# Patient Record
Sex: Male | Born: 1980
Health system: Southern US, Community
[De-identification: ages and names within clinical notes are randomized; demographics above are authoritative.]

## PROBLEM LIST (undated history)

## (undated) DIAGNOSIS — K219 Gastro-esophageal reflux disease without esophagitis: Secondary | ICD-10-CM

## (undated) HISTORY — PX: ESOPHAGOGASTRODUODENOSCOPY: SHX1529

## (undated) HISTORY — PX: WISDOM TOOTH EXTRACTION: SHX21

## (undated) HISTORY — PX: ANTERIOR CRUCIATE LIGAMENT REPAIR: SHX115

---

## 2006-03-30 ENCOUNTER — Emergency Department (HOSPITAL_COMMUNITY): Admission: EM | Admit: 2006-03-30 | Discharge: 2006-03-30 | Payer: Self-pay | Admitting: Emergency Medicine

## 2006-07-06 HISTORY — PX: KNEE ARTHROSCOPY: SUR90

## 2006-10-14 ENCOUNTER — Emergency Department (HOSPITAL_COMMUNITY): Admission: EM | Admit: 2006-10-14 | Discharge: 2006-10-15 | Payer: Self-pay | Admitting: Emergency Medicine

## 2007-01-04 ENCOUNTER — Ambulatory Visit (HOSPITAL_BASED_OUTPATIENT_CLINIC_OR_DEPARTMENT_OTHER): Admission: RE | Admit: 2007-01-04 | Discharge: 2007-01-05 | Payer: Self-pay | Admitting: Orthopaedic Surgery

## 2007-10-20 IMAGING — CR DG ANKLE COMPLETE 3+V*R*
3 series · 3 of 3 positions shown · non-contrast
Comparison: none

CLINICAL DATA: Right medial foot and ankle pain

RIGHT ANKLE - 3  VIEW:

[t ankle joint ap right]
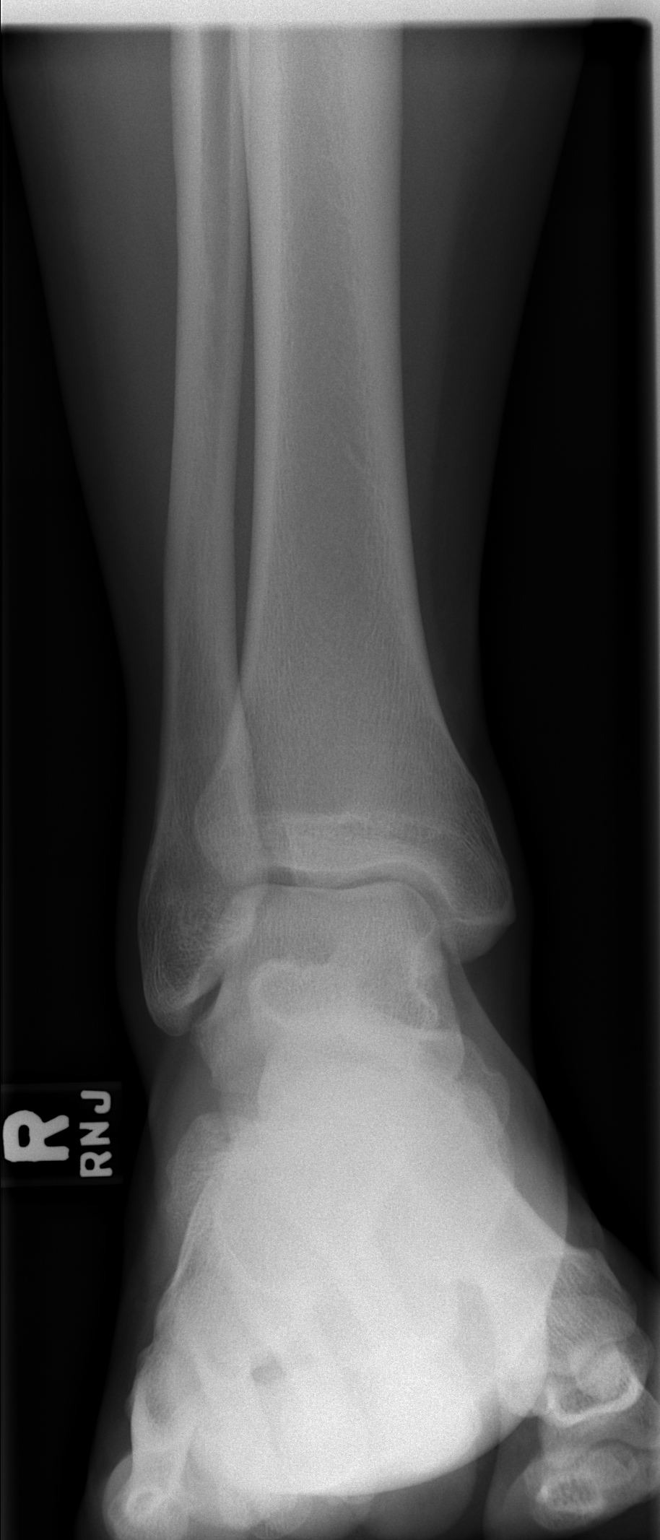

[t ankle joint oblique right]
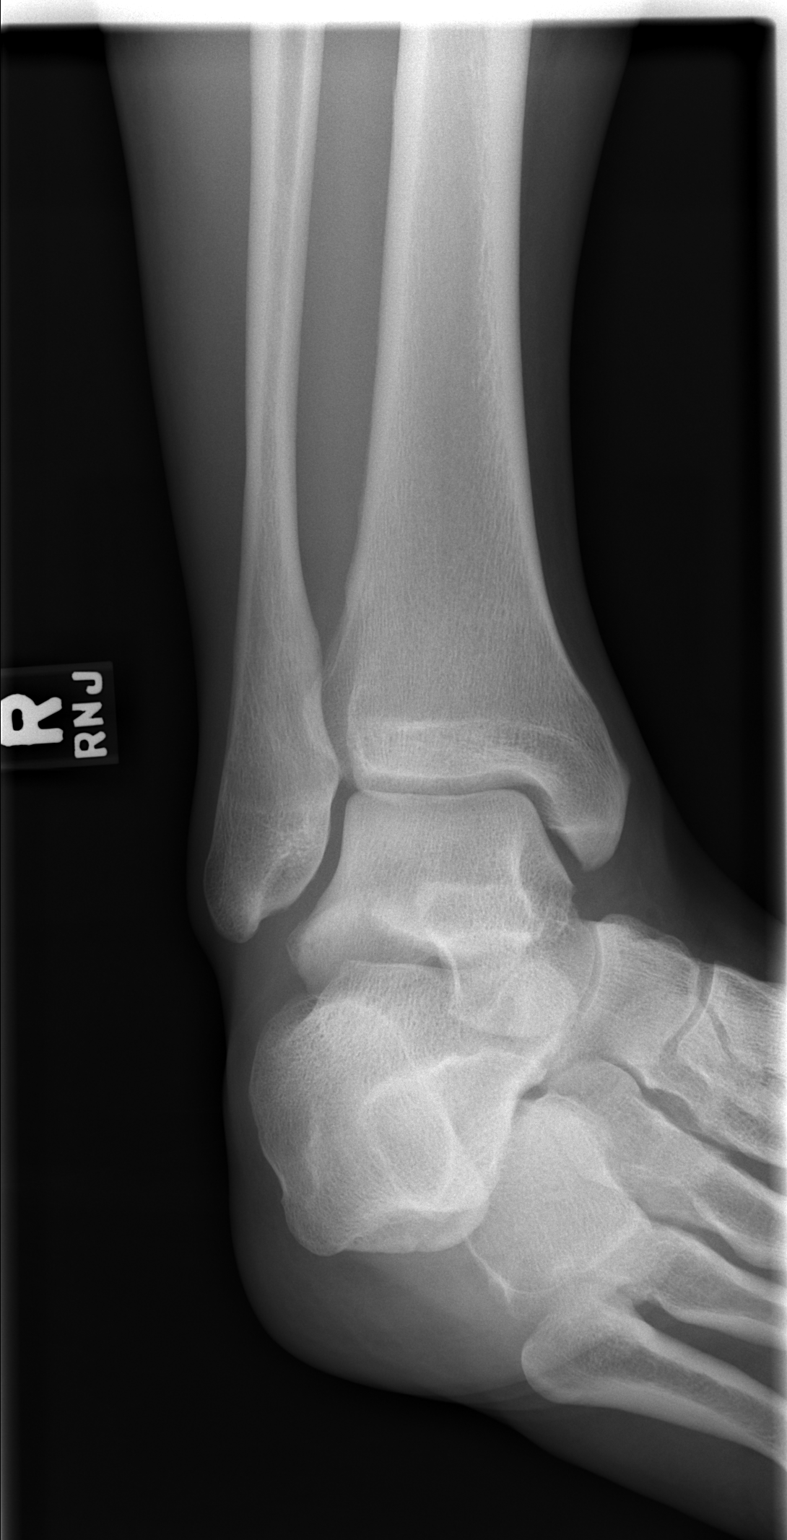

[t ankle joint lat right]
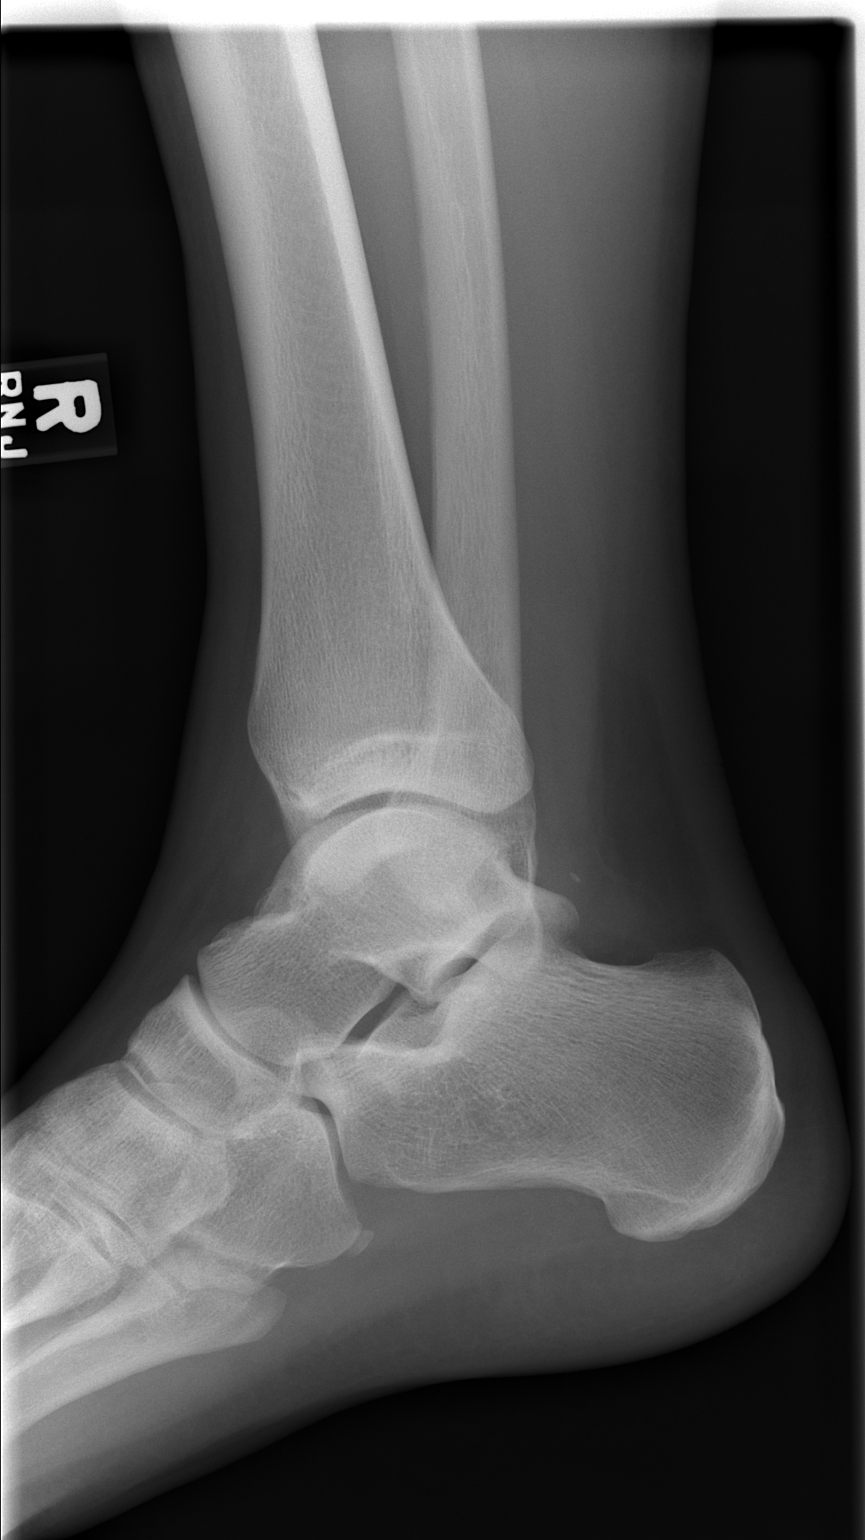

[3 of 3 positions shown; findings below may reference images not displayed]

FINDINGS: There is no evidence of fracture, dislocation, or joint effusion. 
There is no evidence of arthropathy or other focal bone abnormality.  Soft
tissues are unremarkable.
IMPRESSION: Negative.

RIGHT FOOT - 3  VIEW
FINDINGS: There is no evidence of fracture or dislocation.  There is no
evidence of arthropathy or other focal bone abnormality.  Soft tissues are
unremarkable.

IMPRESSION

Negative.

## 2008-12-15 ENCOUNTER — Emergency Department (HOSPITAL_BASED_OUTPATIENT_CLINIC_OR_DEPARTMENT_OTHER): Admission: EM | Admit: 2008-12-15 | Discharge: 2008-12-15 | Payer: Self-pay | Admitting: Emergency Medicine

## 2008-12-22 ENCOUNTER — Ambulatory Visit: Payer: Self-pay | Admitting: Occupational Medicine

## 2008-12-26 ENCOUNTER — Ambulatory Visit: Payer: Self-pay | Admitting: Diagnostic Radiology

## 2008-12-26 ENCOUNTER — Ambulatory Visit (HOSPITAL_BASED_OUTPATIENT_CLINIC_OR_DEPARTMENT_OTHER): Admission: RE | Admit: 2008-12-26 | Discharge: 2008-12-26 | Payer: Self-pay | Admitting: Emergency Medicine

## 2010-07-28 ENCOUNTER — Encounter: Payer: Self-pay | Admitting: Emergency Medicine

## 2010-10-13 LAB — URINALYSIS, ROUTINE W REFLEX MICROSCOPIC
Nitrite: NEGATIVE
Specific Gravity, Urine: 1.03 (ref 1.005–1.030)
Urobilinogen, UA: 0.2 mg/dL (ref 0.0–1.0)

## 2010-10-13 LAB — URINE MICROSCOPIC-ADD ON

## 2010-11-18 NOTE — Op Note (Signed)
Michael Robinson, Michael Robinson             ACCOUNT NO.:  [id number]   MEDICAL RECORD NO.:  [id number]          PATIENT TYPE:  AMB   LOCATION:  DSC                          FACILITY:  [facility]   PHYSICIAN:  Claude Manges. Whitfield, M.D.DATE OF BIRTH:  Dec 07, 1980   DATE OF PROCEDURE:  01/04/2007  DATE OF DISCHARGE:                               OPERATIVE REPORT   PREOPERATIVE DIAGNOSIS:  Anterior cruciate ligament deficient left knee.   POSTOPERATIVE DIAGNOSIS:  Anterior cruciate ligament deficient left  knee.   PROCEDURE:  Allograft anterior cruciate ligament reconstruction, left  knee.   SURGEON:  Claude Manges. Cleophas Dunker, M.D.   ASSISTANT:  Richardean Canal, P.A.   ANESTHESIA:  Femoral nerve block and general orotracheal.   COMPLICATIONS:  None.   HISTORY:  A 30 year old young man sustained an injury to his left knee  in April playing softball.  He had a subsequent MRI scan revealing  meniscal tear and ACL tear and underwent diagnostic arthroscopy on December 21, 2006.  At the time, he had a complete tear of the anterior cruciate  ligament which was debrided and a tear over the posterior portion of  lateral meniscus which was debrided as it was in the white-white zone.  He has had significant problems with his knee with instability since  that time and as recently as one week ago had an episode of his knee  giving way with a recurrent heme-arthrosis.  He is now to have ACL  reconstruction.   PROCEDURE IN DETAIL:  With the patient comfortable on the operating room  table, under general orotracheal anesthesia, the left lower extremity  was placed in a thigh holder in a thigh tourniquet.  The leg was then  prepped with DuraPrep from the thigh holder to the ankle.  Sterile  draping was performed with the extremity elevated, with Esmarch  exsanguinated, with the proximal tourniquet at 350 mmHg.   Diagnostic arthroscopy was performed using the prior medial and lateral  parapatellar tendon puncture sites.   There was a large heme-arthrosis.  This was evacuated.  I reevaluated the medial and lateral compartments  without evidence of articular cartilage injury or meniscal tearing.  The  ACL was obviously torn with only a small thread remaining.  The PCL  remained intact.  I did not seen any loose bodies or evidence of  abnormalities about the patellofemoral joint.  We therefore proceeded  with ACL allograft.   Rexene Edison prepared the graft at 100 mm in length.  The bone plugs were  10 mm in width and easily passed through the 10-mm hole, with bone plugs  25 mm at both ends.   A notchplasty was performed using the green white shaver, the ArthroCare  wand and the 4 mm hooded bur.  I had a very nice decompression  protecting the PCL throughout the procedure.   The Arthrex guide system was utilized.  We initially applied the guide  to the tibia.  A small less than 1 inch incision was made medial to the  patella tendon and the proximal tibia.  Soft tissue was cleared from the  proximal tibia and using the guide, a guide pin was then inserted.  It  was perfectly placed medial to lateral but a little bit anterior and  accordingly, a secondary guide was used to place the pin more posterior  and I felt at that point we had a perfect position.  The 10-mm drill was  then placed over the guide pin protecting the tip of the guide pin with  a curet intraarticularly.  The drill hole was then formed with the  drill.  There was some loose pieces of material in the joint which were  debrided.  Some loose pieces of cartilage and bone around the entrance  hole into the joint, and this was debrided as well.  The femoral guide  was then inserted with the knee flexed beyond 90 degrees.  The Beeth  guide needle was then inserted and externalized along the distal lateral  thigh.  The 10-mm drill was then inserted through the tibial hole into  the joint.  A footprint was then made with bone completely surrounding   the guide pin.  We drilled to approximately 27 mm with a 25 mm bone  plug.  Again, we checked to be sure that we had bone circumferentially.  Any loose material was debrided from the joint.   The ACL allograft was then threaded through the tibia of the joint and  the easily into the femoral hole without difficulty.  A second small  incision was then made over the tibial joint line through the patellar  tendon.  The 7 x 25 metallic interference screw with a sheath was then  applied over the Nitinol guide and easily inserted into the femoral hole  over the bone plug without difficulty.  The pin was then removed with  the knee flexed about 25 degrees of the posterior drawer and tension on  the graft through the tibial hole, a 7 x 25 metallic interference screw  was also inserted.  The graft was checked.  We had very nice tension and  through full range of motion, there was no impingement.  There was a  negative anterior drawer sign.  The joint was inspected for any loose  material and there was none.  At that point, the incisions were closed.  The puncture sites closed with staples.  The incisions over the patella  tendon and the proximal tibia were closed in layers with 0 and 2-0  Vicryl and the skin clips.  A sterile bulky dressing was applied.  Marcaine was injected, followed by the Ace bandage knee immobilizer.  Tourniquet was deflated 90 minutes.   The patient tolerated the procedure without complications.   PLAN:  Recovery care.  Discharge in a.m. on Percocet.      Claude Manges. Cleophas Dunker, M.D.  Electronically Signed     PWW/MEDQ  D:  01/04/2007  T:  01/04/2007  Job:  [id number]

## 2013-10-06 ENCOUNTER — Encounter: Payer: Self-pay | Admitting: Emergency Medicine

## 2013-10-06 ENCOUNTER — Emergency Department (INDEPENDENT_AMBULATORY_CARE_PROVIDER_SITE_OTHER): Payer: Managed Care, Other (non HMO)

## 2013-10-06 ENCOUNTER — Emergency Department
Admission: EM | Admit: 2013-10-06 | Discharge: 2013-10-06 | Disposition: A | Discharge status: Home / Self Care | Payer: Managed Care, Other (non HMO) | Source: Home / Self Care | Attending: Family Medicine | Admitting: Family Medicine

## 2013-10-06 DIAGNOSIS — M109 Gout, unspecified: Secondary | ICD-10-CM

## 2013-10-06 DIAGNOSIS — M79609 Pain in unspecified limb: Secondary | ICD-10-CM

## 2013-10-06 MED ORDER — PREDNISONE 20 MG PO TABS: 20.0000 mg | ORAL_TABLET | Freq: Two times a day (BID) | ORAL | Status: DC

## 2013-10-06 NOTE — Discharge Instructions (Signed)
Increase fluid intake.  May take Tylenol for pain.   Gout Gout is an inflammatory arthritis caused by a buildup of uric acid crystals in the joints. Uric acid is a chemical that is normally present in the blood. When the level of uric acid in the blood is too high it can form crystals that deposit in your joints and tissues. This causes joint redness, soreness, and swelling (inflammation). Repeat attacks are common. Over time, uric acid crystals can form into masses (tophi) near a joint, destroying bone and causing disfigurement. Gout is treatable and often preventable. CAUSES  The disease begins with elevated levels of uric acid in the blood. Uric acid is produced by your body when it breaks down a naturally found substance called purines. Certain foods you eat, such as meats and fish, contain high amounts of purines. Causes of an elevated uric acid level include:  Being passed down from parent to child (heredity).  Diseases that cause increased uric acid production (such as obesity, psoriasis, and certain cancers).  Excessive alcohol use.  Diet, especially diets rich in meat and seafood.  Medicines, including certain cancer-fighting medicines (chemotherapy), water pills (diuretics), and aspirin.  Chronic kidney disease. The kidneys are no longer able to remove uric acid well.  Problems with metabolism. Conditions strongly associated with gout include:  Obesity.  High blood pressure.  High cholesterol.  Diabetes. Not everyone with elevated uric acid levels gets gout. It is not understood why some people get gout and others do not. Surgery, joint injury, and eating too much of certain foods are some of the factors that can lead to gout attacks. SYMPTOMS   An attack of gout comes on quickly. It causes intense pain with redness, swelling, and warmth in a joint.  Fever can occur.  Often, only one joint is involved. Certain joints are more commonly involved:  Base of the big  toe.  Knee.  Ankle.  Wrist.  Finger. Without treatment, an attack usually goes away in a few days to weeks. Between attacks, you usually will not have symptoms, which is different from many other forms of arthritis. DIAGNOSIS  Your caregiver will suspect gout based on your symptoms and exam. In some cases, tests may be recommended. The tests may include:  Blood tests.  Urine tests.  X-rays.  Joint fluid exam. This exam requires a needle to remove fluid from the joint (arthrocentesis). Using a microscope, gout is confirmed when uric acid crystals are seen in the joint fluid. TREATMENT  There are two phases to gout treatment: treating the sudden onset (acute) attack and preventing attacks (prophylaxis).  Treatment of an Acute Attack.  Medicines are used. These include anti-inflammatory medicines or steroid medicines.  An injection of steroid medicine into the affected joint is sometimes necessary.  The painful joint is rested. Movement can worsen the arthritis.  You may use warm or cold treatments on painful joints, depending which works best for you.  Treatment to Prevent Attacks.  If you suffer from frequent gout attacks, your caregiver may advise preventive medicine. These medicines are started after the acute attack subsides. These medicines either help your kidneys eliminate uric acid from your body or decrease your uric acid production. You may need to stay on these medicines for a very long time.  The early phase of treatment with preventive medicine can be associated with an increase in acute gout attacks. For this reason, during the first few months of treatment, your caregiver may also advise you to  take medicines usually used for acute gout treatment. Be sure you understand your caregiver's directions. Your caregiver may make several adjustments to your medicine dose before these medicines are effective.  Discuss dietary treatment with your caregiver or dietitian.  Alcohol and drinks high in sugar and fructose and foods such as meat, poultry, and seafood can increase uric acid levels. Your caregiver or dietician can advise you on drinks and foods that should be limited. HOME CARE INSTRUCTIONS   Do not take aspirin to relieve pain. This raises uric acid levels.  Only take over-the-counter or prescription medicines for pain, discomfort, or fever as directed by your caregiver.  Rest the joint as much as possible. When in bed, keep sheets and blankets off painful areas.  Keep the affected joint raised (elevated).  Apply warm or cold treatments to painful joints. Use of warm or cold treatments depends on which works best for you.  Use crutches if the painful joint is in your leg.  Drink enough fluids to keep your urine clear or pale yellow. This helps your body get rid of uric acid. Limit alcohol, sugary drinks, and fructose drinks.  Follow your dietary instructions. Pay careful attention to the amount of protein you eat. Your daily diet should emphasize fruits, vegetables, whole grains, and fat-free or low-fat milk products. Discuss the use of coffee, vitamin C, and cherries with your caregiver or dietician. These may be helpful in lowering uric acid levels.  Maintain a healthy body weight. SEEK MEDICAL CARE IF:   You develop diarrhea, vomiting, or any side effects from medicines.  You do not feel better in 24 hours, or you are getting worse. SEEK IMMEDIATE MEDICAL CARE IF:   Your joint becomes suddenly more tender, and you have chills or a fever. MAKE SURE YOU:   Understand these instructions.  Will watch your condition.  Will get help right away if you are not doing well or get worse. Document Released: 06/19/2000 Document Revised: 10/17/2012 Document Reviewed: 02/03/2012 [facility] Patient Information 2014 [place], [place].

## 2013-10-06 NOTE — ED Notes (Signed)
Rt foot pain since last night

## 2013-10-06 NOTE — ED Provider Notes (Signed)
CSN: [id number]     Arrival date & time 10/06/13  1653 History   First MD Initiated Contact with Patient 10/06/13 1824     Chief Complaint  Patient presents with  . Foot Pain      HPI Comments: Patient complains of onset of pain in the great toe of his right foot last night.  No known injury.  No recent change in activities.  No fevers, chills, and sweats   Patient is a 33 y.o. male presenting with lower extremity pain. The history is provided by the patient.  Foot Pain This is a new problem. The current episode started yesterday. The problem occurs constantly. The problem has been gradually worsening. Associated symptoms comments: none. The symptoms are aggravated by walking. He has tried nothing for the symptoms.    History reviewed. No pertinent past medical history. Past Surgical History  Procedure Laterality Date  . Anterior cruciate ligament repair     No family history on file. History  Substance Use Topics  . Smoking status: Never Smoker   . Smokeless tobacco: Not on file  . Alcohol Use: Yes    Review of Systems  All other systems reviewed and are negative.    Allergies  Review of patient's allergies indicates no known allergies.  Home Medications   Current Outpatient Rx  Name  Route  Sig  Dispense  Refill  . predniSONE (DELTASONE) 20 MG tablet   Oral   Take 1 tablet (20 mg total) by mouth 2 (two) times daily. Take with food.   10 tablet   0    BP 113/77  Pulse 80  Temp(Src) 98.4 F (36.9 C) (Oral)  Ht 6' (1.829 m)  Wt 213 lb (96.616 kg)  BMI 28.88 kg/m2  SpO2 97% Physical Exam  Nursing note and vitals reviewed. Constitutional: He is oriented to person, place, and time. He appears well-developed and well-nourished. No distress.  HENT:  Head: Atraumatic.  Eyes: Conjunctivae are normal. Pupils are equal, round, and reactive to light.  Musculoskeletal:       Right foot: He exhibits decreased range of motion, tenderness and bony tenderness. He exhibits  no swelling, normal capillary refill, no crepitus, no deformity and no laceration.       Feet:  There is warmth and distinct tenderness over the right MTP joint.  No swelling.  Mild erythema present.  Distal neurovascular function is intact.   Neurological: He is alert and oriented to person, place, and time.  Skin: Skin is warm and dry. No rash noted.    ED Course  Procedures  none    Imaging Review Dg Foot Complete Right  10/06/2013   CLINICAL DATA:  Foot pain without injury  EXAM: RIGHT FOOT COMPLETE - 3+ VIEW  COMPARISON:  None.  FINDINGS: There is no evidence of fracture or dislocation. There is no evidence of arthropathy or other focal bone abnormality. Soft tissues are unremarkable.  IMPRESSION: No acute abnormality noted.   Electronically Signed   By: Alcide CleverMark  Lukens M.D.   On: 10/06/2013 18:20     MDM   1. Acute gout    Uric acid pending Begin prednisone burst. Increase fluid intake.  May take Tylenol for pain. Followup with Family Doctor if not improved in one week.     Lattie HawStephen A Beese, MD 10/08/13 [phone]

## 2013-10-07 LAB — URIC ACID: URIC ACID, SERUM: 7.1 mg/dL (ref 4.0–7.8)

## 2014-01-17 ENCOUNTER — Encounter (HOSPITAL_BASED_OUTPATIENT_CLINIC_OR_DEPARTMENT_OTHER): Payer: Self-pay | Admitting: Emergency Medicine

## 2014-01-17 ENCOUNTER — Emergency Department (HOSPITAL_BASED_OUTPATIENT_CLINIC_OR_DEPARTMENT_OTHER)
Admission: EM | Admit: 2014-01-17 | Discharge: 2014-01-17 | Disposition: A | Discharge status: Home / Self Care | Payer: Managed Care, Other (non HMO) | Attending: Emergency Medicine | Admitting: Emergency Medicine

## 2014-01-17 DIAGNOSIS — R197 Diarrhea, unspecified: Secondary | ICD-10-CM

## 2014-01-17 DIAGNOSIS — R5383 Other fatigue: Secondary | ICD-10-CM

## 2014-01-17 DIAGNOSIS — R109 Unspecified abdominal pain: Secondary | ICD-10-CM | POA: Insufficient documentation

## 2014-01-17 DIAGNOSIS — R638 Other symptoms and signs concerning food and fluid intake: Secondary | ICD-10-CM | POA: Insufficient documentation

## 2014-01-17 DIAGNOSIS — R5381 Other malaise: Secondary | ICD-10-CM | POA: Insufficient documentation

## 2014-01-17 DIAGNOSIS — Z792 Long term (current) use of antibiotics: Secondary | ICD-10-CM | POA: Insufficient documentation

## 2014-01-17 DIAGNOSIS — Z8719 Personal history of other diseases of the digestive system: Secondary | ICD-10-CM | POA: Insufficient documentation

## 2014-01-17 DIAGNOSIS — R110 Nausea: Secondary | ICD-10-CM | POA: Insufficient documentation

## 2014-01-17 DIAGNOSIS — IMO0002 Reserved for concepts with insufficient information to code with codable children: Secondary | ICD-10-CM | POA: Insufficient documentation

## 2014-01-17 HISTORY — DX: Gastro-esophageal reflux disease without esophagitis: K21.9

## 2014-01-17 LAB — GI PATHOGEN PANEL BY PCR, STOOL
C difficile toxin A/B: NEGATIVE
Campylobacter by PCR: NEGATIVE
Cryptosporidium by PCR: NEGATIVE
E COLI 0157 BY PCR: NEGATIVE
E coli (ETEC) LT/ST: NEGATIVE
E coli (STEC): NEGATIVE
G LAMBLIA BY PCR: NEGATIVE
NOROVIRUS G1/G2: NEGATIVE
Rotavirus A by PCR: NEGATIVE
SALMONELLA BY PCR: NEGATIVE
SHIGELLA BY PCR: NEGATIVE

## 2014-01-17 LAB — URINALYSIS, ROUTINE W REFLEX MICROSCOPIC
BILIRUBIN URINE: NEGATIVE
Glucose, UA: NEGATIVE mg/dL
KETONES UR: NEGATIVE mg/dL
LEUKOCYTES UA: NEGATIVE
NITRITE: NEGATIVE
PH: 5.5 (ref 5.0–8.0)
PROTEIN: NEGATIVE mg/dL
Specific Gravity, Urine: 1.026 (ref 1.005–1.030)
Urobilinogen, UA: 0.2 mg/dL (ref 0.0–1.0)

## 2014-01-17 LAB — URINE MICROSCOPIC-ADD ON

## 2014-01-17 MED ORDER — ONDANSETRON HCL 4 MG/2ML IJ SOLN
4.0000 mg | Freq: Once | INTRAMUSCULAR | Status: AC
  Administered 2014-01-17: 4 mg via INTRAVENOUS
  Filled 2014-01-17: qty 2

## 2014-01-17 MED ORDER — ONDANSETRON HCL 4 MG PO TABS: 4.0000 mg | ORAL_TABLET | Freq: Three times a day (TID) | ORAL | Status: DC | PRN

## 2014-01-17 MED ORDER — SODIUM CHLORIDE 0.9 % IV BOLUS (SEPSIS)
1000.0000 mL | Freq: Once | INTRAVENOUS | Status: AC
  Administered 2014-01-17: 1000 mL via INTRAVENOUS

## 2014-01-17 MED ORDER — DICYCLOMINE HCL 20 MG PO TABS: 20.0000 mg | ORAL_TABLET | Freq: Two times a day (BID) | ORAL | Status: DC | PRN

## 2014-01-17 NOTE — ED Provider Notes (Signed)
CSN: [id number]     Arrival date & time 01/17/14  4098 History   First MD Initiated Contact with Patient 01/17/14 0700     Chief Complaint  Patient presents with  . Diarrhea     (Consider location/radiation/quality/duration/timing/severity/associated sxs/prior Treatment) Patient is a 33 y.o. male presenting with diarrhea. The history is provided by the patient.  Diarrhea Quality:  Watery Associated symptoms: abdominal pain   Associated symptoms: no headaches and no vomiting    patient presents with watery diarrhea. He's had for around 5 days. He's had some chills. He's had nausea without frank vomiting. He states the area was initially all water but now is a little green in color. Some crampy abdominal pain. No sick contacts. He he was recently at the beach in some raw oysters. He was around a week prior to the start of the diarrhea. His been seen by his primary care Dr. started on Cipro and Flagyl. He continues to feel poorly. No other recent travel. No other recent antibiotics.  Past Medical History  Diagnosis Date  . GERD (gastroesophageal reflux disease)    Past Surgical History  Procedure Laterality Date  . Anterior cruciate ligament repair     History reviewed. No pertinent family history. History  Substance Use Topics  . Smoking status: Never Smoker   . Smokeless tobacco: Not on file  . Alcohol Use: Yes    Review of Systems  Constitutional: Positive for appetite change and fatigue. Negative for activity change.  Eyes: Negative for pain.  Respiratory: Negative for chest tightness and shortness of breath.   Cardiovascular: Negative for chest pain and leg swelling.  Gastrointestinal: Positive for nausea, abdominal pain and diarrhea. Negative for vomiting and blood in stool.  Genitourinary: Negative for flank pain.  Musculoskeletal: Negative for back pain and neck stiffness.  Skin: Negative for rash.  Neurological: Negative for weakness, numbness and headaches.   Psychiatric/Behavioral: Negative for behavioral problems.      Allergies  Review of patient's allergies indicates no known allergies.  Home Medications   Prior to Admission medications   Medication Sig Start Date End Date Taking? Authorizing Provider  metroNIDAZOLE (FLAGYL) 500 MG tablet Take 500 mg by mouth 3 (three) times daily.   Yes Historical Provider, MD  ondansetron (ZOFRAN-ODT) 4 MG disintegrating tablet Take 4 mg by mouth every 8 (eight) hours as needed for nausea or vomiting.   Yes Historical Provider, MD  predniSONE (DELTASONE) 20 MG tablet Take 1 tablet (20 mg total) by mouth 2 (two) times daily. Take with food. 10/06/13   Lattie Haw, MD   BP 116/78  Pulse 98  Temp(Src) 98.4 F (36.9 C) (Oral)  Resp 18  Ht 6' (1.829 m)  Wt 225 lb (102.059 kg)  BMI 30.51 kg/m2  SpO2 98% Physical Exam  Nursing note and vitals reviewed. Constitutional: He is oriented to person, place, and time. He appears well-developed and well-nourished.  HENT:  Head: Normocephalic and atraumatic.  Eyes: EOM are normal. Pupils are equal, round, and reactive to light.  Neck: Normal range of motion. Neck supple.  Cardiovascular: Normal rate, regular rhythm and normal heart sounds.   No murmur heard. Pulmonary/Chest: Effort normal and breath sounds normal.  Abdominal: Soft. He exhibits no distension. There is no tenderness.  Musculoskeletal: Normal range of motion. He exhibits no edema.  Neurological: He is alert and oriented to person, place, and time. No cranial nerve deficit.  Skin: Skin is warm and dry.  Psychiatric: He has a  normal mood and affect.    ED Course  Procedures (including critical care time) Labs Review Labs Reviewed  URINALYSIS, ROUTINE W REFLEX MICROSCOPIC - Abnormal; Notable for the following:    Color, Urine AMBER (*)    Hgb urine dipstick MODERATE (*)    All other components within normal limits  URINE MICROSCOPIC-ADD ON - Abnormal; Notable for the following:     Bacteria, UA MANY (*)    All other components within normal limits  GI PATHOGEN PANEL BY PCR, STOOL    Imaging Review No results found.   EKG Interpretation None      MDM   Final diagnoses:  None    Patient with diarrhea for the last 5 days. Watery. Urinalysis is reassuring and does not show that the patient is overly dehydrated. He feels better after 2 L of fluid. We'll send stool cultures which will need to be followed by PCP or gastroenterology. Patient has tolerated orals will be discharged home    Juliet Rudeathan R. Rubin PayorPickering, MD 01/17/14 1440

## 2014-01-17 NOTE — ED Notes (Signed)
Pt reports that he was given 2 antibiotics

## 2014-01-17 NOTE — ED Notes (Signed)
MD at bedside. 

## 2014-01-17 NOTE — ED Notes (Signed)
Pt reports he has had diarrhea since Sunday, saw pcp was given nausea medicine on Monday, but diarrhea

## 2014-01-17 NOTE — Discharge Instructions (Signed)
Diarrhea  Diarrhea is frequent loose and watery bowel movements. It can cause you to feel weak and dehydrated. Dehydration can cause you to become tired and thirsty, have a dry mouth, and have decreased urination that often is dark yellow. Diarrhea is a sign of another problem, most often an infection that will not last long. In most cases, diarrhea typically lasts 2-3 days. However, it can last longer if it is a sign of something more serious. It is important to treat your diarrhea as directed by your caregive to lessen or prevent future episodes of diarrhea.  CAUSES   Some common causes include:   Gastrointestinal infections caused by viruses, bacteria, or parasites.   Food poisoning or food allergies.   Certain medicines, such as antibiotics, chemotherapy, and laxatives.   Artificial sweeteners and fructose.   Digestive disorders.  HOME CARE INSTRUCTIONS   Ensure adequate fluid intake (hydration): have 1 cup (8 oz) of fluid for each diarrhea episode. Avoid fluids that contain simple sugars or sports drinks, fruit juices, whole milk products, and sodas. Your urine should be clear or pale yellow if you are drinking enough fluids. Hydrate with an oral rehydration solution that you can purchase at pharmacies, retail stores, and online. You can prepare an oral rehydration solution at home by mixing the following ingredients together:    - tsp table salt.    tsp baking soda.    tsp salt substitute containing potassium chloride.   1  tablespoons sugar.   1 L (34 oz) of water.   Certain foods and beverages may increase the speed at which food moves through the gastrointestinal (GI) tract. These foods and beverages should be avoided and include:   Caffeinated and alcoholic beverages.   High-fiber foods, such as raw fruits and vegetables, nuts, seeds, and whole grain breads and cereals.   Foods and beverages sweetened with sugar alcohols, such as xylitol, sorbitol, and mannitol.   Some foods may be well  tolerated and may help thicken stool including:   Starchy foods, such as rice, toast, pasta, low-sugar cereal, oatmeal, grits, baked potatoes, crackers, and bagels.   Bananas.   Applesauce.   Add probiotic-rich foods to help increase healthy bacteria in the GI tract, such as yogurt and fermented milk products.   Wash your hands well after each diarrhea episode.   Only take over-the-counter or prescription medicines as directed by your caregiver.   Take a warm bath to relieve any burning or pain from frequent diarrhea episodes.  SEEK IMMEDIATE MEDICAL CARE IF:    You are unable to keep fluids down.   You have persistent vomiting.   You have blood in your stool, or your stools are black and tarry.   You do not urinate in 6-8 hours, or there is only a small amount of very dark urine.   You have abdominal pain that increases or localizes.   You have weakness, dizziness, confusion, or lightheadedness.   You have a severe headache.   Your diarrhea gets worse or does not get better.   You have a fever or persistent symptoms for more than 2-3 days.   You have a fever and your symptoms suddenly get worse.  MAKE SURE YOU:    Understand these instructions.   Will watch your condition.   Will get help right away if you are not doing well or get worse.  Document Released: 06/12/2002 Document Revised: 06/08/2012 Document Reviewed: 02/28/2012  [employer] Patient Information 2015 [employer]. This information   is not intended to replace advice given to you by your health care provider. Make sure you discuss any questions you have with your health care provider.

## 2014-01-24 ENCOUNTER — Telehealth (HOSPITAL_COMMUNITY): Payer: Self-pay | Admitting: Emergency Medicine

## 2014-01-31 ENCOUNTER — Ambulatory Visit (INDEPENDENT_AMBULATORY_CARE_PROVIDER_SITE_OTHER): Payer: Managed Care, Other (non HMO) | Admitting: Cardiology

## 2014-01-31 ENCOUNTER — Encounter: Payer: Self-pay | Admitting: Cardiology

## 2014-01-31 VITALS — BP 122/78 | HR 61 | Ht 72.0 in | Wt 221.0 lb

## 2014-01-31 DIAGNOSIS — R079 Chest pain, unspecified: Secondary | ICD-10-CM

## 2014-01-31 DIAGNOSIS — Z8249 Family history of ischemic heart disease and other diseases of the circulatory system: Secondary | ICD-10-CM

## 2014-01-31 NOTE — Progress Notes (Signed)
   Michael Robinson Date of Birth: 1980/09/22 Medical Record #[id number]  History of Present Illness: Michael Robinson is self referred for cardiac evaluation. He is a pleasant 33 yo WM with a strong family history of CAD. His father just had CABG at age 865. Both paternal grandparents died with MI- his grandfather at age 35. He denies any history of HTN, DM, or tobacco abuse. Does not know his cholesterol. He has noted intermittent chest tightness with stress or anxiety. He does eat a heart healthy diet. He works for [employer].    Medication List    Notice As of 01/31/2014  1:18 PM   You have not been prescribed any medications.      No Known Allergies  Past Medical History  Diagnosis Date  . GERD (gastroesophageal reflux disease)     Past Surgical History  Procedure Laterality Date  . Anterior cruciate ligament repair      History   Social History  . Marital Status: Married    Spouse Name: N/A    Number of Children: 2  . Years of Education: N/A   Occupational History  . logistics [employer]    Social History Main Topics  . Smoking status: Never Smoker   . Smokeless tobacco: None  . Alcohol Use: Yes  . Drug Use: None  . Sexual Activity: None   Other Topics Concern  . None   Social History Narrative  . None    History reviewed. No pertinent family history.  Review of Systems: As noted in HPI.  All other systems were reviewed and are negative.  Physical Exam: BP 122/78  Pulse 61  Ht 6' (1.829 m)  Wt 221 lb (100.245 kg)  BMI 29.97 kg/m2 Filed Weights   01/31/14 0921  Weight: 221 lb (100.245 kg)  GENERAL:  Well appearing WM in NAD HEENT:  PERRL, EOMI, sclera are clear. Oropharynx is clear. NECK:  No jugular venous distention, carotid upstroke brisk and symmetric, no bruits, no thyromegaly or adenopathy LUNGS:  Clear to auscultation bilaterally CHEST:  Unremarkable HEART:  RRR,  PMI not displaced or sustained,S1 and S2 within normal limits, no S3, no S4: no  clicks, no rubs, no murmurs ABD:  Soft, nontender. BS +, no masses or bruits. No hepatomegaly, no splenomegaly EXT:  2 + pulses throughout, no edema, no cyanosis no clubbing SKIN:  Warm and dry.  No rashes NEURO:  Alert and oriented x 3. Cranial nerves II through XII intact. PSYCH:  Cognitively intact    LABORATORY DATA: Ecg: NSR with normal Ecg.  Assessment / Plan: 1. Symptoms of chest tightness. Possible angina. Will arrange a GXT.  2. Strong family history of CAD  Plan: schedule GXT. Stressed importance of heart healthy diet and regular aerobic exercise. Will obtain fasting lab work including chemistries, CBC, and lipids.

## 2014-01-31 NOTE — Patient Instructions (Signed)
Recommend a Mediterranean style diet  Recommend regular aerobic exercise 30-45 minutes daily  Will schedule you for fasting lab work and an exercise stress test.

## 2014-02-16 ENCOUNTER — Encounter (HOSPITAL_COMMUNITY): Payer: Managed Care, Other (non HMO)

## 2014-06-09 ENCOUNTER — Emergency Department
Admission: EM | Admit: 2014-06-09 | Discharge: 2014-06-09 | Disposition: A | Discharge status: Home / Self Care | Payer: Managed Care, Other (non HMO) | Source: Home / Self Care | Attending: Family Medicine | Admitting: Family Medicine

## 2014-06-09 ENCOUNTER — Emergency Department (INDEPENDENT_AMBULATORY_CARE_PROVIDER_SITE_OTHER): Payer: Managed Care, Other (non HMO)

## 2014-06-09 DIAGNOSIS — M545 Low back pain: Secondary | ICD-10-CM

## 2014-06-09 DIAGNOSIS — M5416 Radiculopathy, lumbar region: Secondary | ICD-10-CM

## 2014-06-09 DIAGNOSIS — M5417 Radiculopathy, lumbosacral region: Secondary | ICD-10-CM

## 2014-06-09 MED ORDER — CYCLOBENZAPRINE HCL 10 MG PO TABS: ORAL_TABLET | ORAL | Status: DC

## 2014-06-09 MED ORDER — HYDROCODONE-ACETAMINOPHEN 5-325 MG PO TABS: ORAL_TABLET | ORAL | Status: DC

## 2014-06-09 MED ORDER — KETOROLAC TROMETHAMINE 60 MG/2ML IM SOLN
60.0000 mg | Freq: Once | INTRAMUSCULAR | Status: AC
  Administered 2014-06-09: 60 mg via INTRAMUSCULAR

## 2014-06-09 MED ORDER — MELOXICAM 15 MG PO TABS: 15.0000 mg | ORAL_TABLET | Freq: Every day | ORAL | Status: DC

## 2014-06-09 NOTE — ED Provider Notes (Signed)
CSN: [id number]     Arrival date & time 06/09/14  1130 History   First MD Initiated Contact with Patient 06/09/14 1142     Chief Complaint  Patient presents with  . Back Pain    Right side       HPI Comments: Patient was cutting trees today.  About one hour prior to his visit he bent over to pull a log and felt sudden pain in his right lower back radiating to his right anterior thigh to the knee.  No bowel or bladder dysfunction.  No saddle numbness. In 2010 he developed back pain and underwent MRI of lumbar spine that revealed a possible minimal vertebral fracture  Patient is a 33 y.o. male presenting with back pain. The history is provided by the patient and the spouse.  Back Pain Location:  Lumbar spine Quality:  Aching Radiates to: right anterior thigh. Pain severity:  Moderate Pain is:  Same all the time Onset quality:  Sudden Duration:  2 hours Timing:  Constant Chronicity:  Recurrent Context comment:  Cutting trees Relieved by:  Nothing Worsened by:  Movement Ineffective treatments:  None tried Associated symptoms: paresthesias and tingling   Associated symptoms: no abdominal pain, no bladder incontinence, no bowel incontinence, no dysuria, no fever, no leg pain, no numbness, no perianal numbness, no weakness and no weight loss     Past Medical History  Diagnosis Date  . GERD (gastroesophageal reflux disease)    Past Surgical History  Procedure Laterality Date  . Anterior cruciate ligament repair     No family history on file. History  Substance Use Topics  . Smoking status: Never Smoker   . Smokeless tobacco: Not on file  . Alcohol Use: Yes    Review of Systems  Constitutional: Negative for fever and weight loss.  Gastrointestinal: Negative for abdominal pain and bowel incontinence.  Genitourinary: Negative for bladder incontinence and dysuria.  Musculoskeletal: Positive for back pain.  Neurological: Positive for tingling and paresthesias. Negative for  weakness and numbness.  All other systems reviewed and are negative.   Allergies  Review of patient's allergies indicates no known allergies.  Home Medications   Prior to Admission medications   Medication Sig Start Date End Date Taking? Authorizing Provider  cyclobenzaprine (FLEXERIL) 10 MG tablet Take one tab by mouth at bedtime for muscle spasm 06/09/14   Lattie HawStephen A Dinna Severs, MD  HYDROcodone-acetaminophen (NORCO/VICODIN) 5-325 MG per tablet Take one by mouth at bedtime as needed for pain 06/09/14   Lattie HawStephen A Josph [place], MD  meloxicam (MOBIC) 15 MG tablet Take 1 tablet (15 mg total) by mouth daily. Take with food each morning 06/09/14   Lattie HawStephen A Maliah Pyles, MD   BP 114/78 mmHg  Pulse 72  Temp(Src) 98.2 F (36.8 C) (Oral)  Wt 225 lb (102.059 kg)  SpO2 98% Physical Exam  Constitutional: He is oriented to person, place, and time. He appears well-developed and well-nourished. No distress.  HENT:  Head: Atraumatic.  Eyes: Pupils are equal, round, and reactive to light.  Cardiovascular: Normal heart sounds.   Pulmonary/Chest: Breath sounds normal.  Abdominal: Bowel sounds are normal. He exhibits no distension. There is no tenderness. There is no rebound.  Musculoskeletal:       Lumbar back: He exhibits decreased range of motion. He exhibits no tenderness, no bony tenderness, no swelling, no edema, no deformity and no laceration.       Back:  Back:  Can heel/toe walk and squat without difficulty.  Decreased  forward flexion.   Straight leg raising test is negative.  Sitting knee extension test is negative.  Strength and sensation in the lower extremities is normal.  Patellar and achilles reflexes are normal. Location of subjective pain as noted on diagram, but no tenderness to palpation     Neurological: He is alert and oriented to person, place, and time.  Skin: Skin is warm and dry. No rash noted.  Nursing note and vitals reviewed.   ED Course  Procedures  none   Imaging Review Dg Lumbar  Spine Complete  06/09/2014   CLINICAL DATA:  Low back pain  EXAM: LUMBAR SPINE - COMPLETE 4+ VIEW  COMPARISON:  Lumbar spine MRI 12/26/2008  FINDINGS: There is no evidence of lumbar spine fracture. Alignment is normal. Intervertebral disc spaces are maintained.  IMPRESSION: Negative.   Electronically Signed   By: Christiana PellantGretchen  Green M.D.   On: 06/09/2014 12:54     MDM   1. Lumbar back pain with radiculopathy affecting right lower extremity    Begin Mobic and Flexeril.  Lortab for pain at night. Apply ice pack for 20 to 30 minutes, 3 to 4 times daily  Continue until pain decreases.  Begin back exercises as tolerated. Followup with Dr. Rodney Langtonhomas Thekkekandam ([facility]) if not improving about two weeks.     Lattie HawStephen A Anella Nakata, MD 06/12/14 [phone]

## 2014-06-09 NOTE — ED Notes (Signed)
Patient states was cutting trees today and injured back, radiates to right side

## 2014-06-09 NOTE — Discharge Instructions (Signed)
Apply ice pack for 20 to 30 minutes, 3 to 4 times daily  Continue until pain decreases.  Begin back exercises as tolerated.   Back Exercises Back exercises help treat and prevent back injuries. The goal of back exercises is to increase the strength of your abdominal and back muscles and the flexibility of your back. These exercises should be started when you no longer have back pain. Back exercises include:  Pelvic Tilt. Lie on your back with your knees bent. Tilt your pelvis until the lower part of your back is against the floor. Hold this position 5 to 10 sec and repeat 5 to 10 times.  Knee to Chest. Pull first 1 knee up against your chest and hold for 20 to 30 seconds, repeat this with the other knee, and then both knees. This may be done with the other leg straight or bent, whichever feels better.  Sit-Ups or Curl-Ups. Bend your knees 90 degrees. Start with tilting your pelvis, and do a partial, slow sit-up, lifting your trunk only 30 to 45 degrees off the floor. Take at least 2 to 3 seconds for each sit-up. Do not do sit-ups with your knees out straight. If partial sit-ups are difficult, simply do the above but with only tightening your abdominal muscles and holding it as directed.  Hip-Lift. Lie on your back with your knees flexed 90 degrees. Push down with your feet and shoulders as you raise your hips a couple inches off the floor; hold for 10 seconds, repeat 5 to 10 times.  Back arches. Lie on your stomach, propping yourself up on bent elbows. Slowly press on your hands, causing an arch in your low back. Repeat 3 to 5 times. Any initial stiffness and discomfort should lessen with repetition over time.  Shoulder-Lifts. Lie face down with arms beside your body. Keep hips and torso pressed to floor as you slowly lift your head and shoulders off the floor. Do not overdo your exercises, especially in the beginning. Exercises may cause you some mild back discomfort which lasts for a few minutes;  however, if the pain is more severe, or lasts for more than 15 minutes, do not continue exercises until you see your caregiver. Improvement with exercise therapy for back problems is slow.  See your caregivers for assistance with developing a proper back exercise program. Document Released: 07/30/2004 Document Revised: 09/14/2011 Document Reviewed: 04/23/2011 [facility] Patient Information 2015 [place], [place]. This information is not intended to replace advice given to you by your health care provider. Make sure you discuss any questions you have with your health care provider.   Lumbosacral Strain Lumbosacral strain is a strain of any of the parts that make up your lumbosacral vertebrae. Your lumbosacral vertebrae are the bones that make up the lower third of your backbone. Your lumbosacral vertebrae are held together by muscles and tough, fibrous tissue (ligaments).  CAUSES  A sudden blow to your back can cause lumbosacral strain. Also, anything that causes an excessive stretch of the muscles in the low back can cause this strain. This is typically seen when people exert themselves strenuously, fall, lift heavy objects, bend, or crouch repeatedly. RISK FACTORS  Physically demanding work.  Participation in pushing or pulling sports or sports that require a sudden twist of the back (tennis, golf, baseball).  Weight lifting.  Excessive lower back curvature.  Forward-tilted pelvis.  Weak back or abdominal muscles or both.  Tight hamstrings. SIGNS AND SYMPTOMS  Lumbosacral strain may cause pain in  the area of your injury or pain that moves (radiates) down your leg.  DIAGNOSIS Your health care provider can often diagnose lumbosacral strain through a physical exam. In some cases, you may need tests such as X-ray exams.  TREATMENT  Treatment for your lower back injury depends on many factors that your clinician will have to evaluate. However, most treatment will include the use of  anti-inflammatory medicines. HOME CARE INSTRUCTIONS   Avoid hard physical activities (tennis, racquetball, waterskiing) if you are not in proper physical condition for it. This may aggravate or create problems.  If you have a back problem, avoid sports requiring sudden body movements. Swimming and walking are generally safer activities.  Maintain good posture.  Maintain a healthy weight.  For acute conditions, you may put ice on the injured area.  Put ice in a plastic bag.  Place a towel between your skin and the bag.  Leave the ice on for 20 minutes, 2-3 times a day.  When the low back starts healing, stretching and strengthening exercises may be recommended. SEEK MEDICAL CARE IF:  Your back pain is getting worse.  You experience severe back pain not relieved with medicines. SEEK IMMEDIATE MEDICAL CARE IF:   You have numbness, tingling, weakness, or problems with the use of your arms or legs.  There is a change in bowel or bladder control.  You have increasing pain in any area of the body, including your belly (abdomen).  You notice shortness of breath, dizziness, or feel faint.  You feel sick to your stomach (nauseous), are throwing up (vomiting), or become sweaty.  You notice discoloration of your toes or legs, or your feet get very cold. MAKE SURE YOU:   Understand these instructions.  Will watch your condition.  Will get help right away if you are not doing well or get worse. Document Released: 04/01/2005 Document Revised: 06/27/2013 Document Reviewed: 02/08/2013 [facility] Patient Information 2015 [place], [place]. This information is not intended to replace advice given to you by your health care provider. Make sure you discuss any questions you have with your health care provider.

## 2014-06-13 ENCOUNTER — Other Ambulatory Visit: Payer: Self-pay | Admitting: Specialist

## 2014-06-13 DIAGNOSIS — M545 Low back pain, unspecified: Secondary | ICD-10-CM

## 2014-06-15 ENCOUNTER — Ambulatory Visit
Admission: RE | Admit: 2014-06-15 | Discharge: 2014-06-15 | Disposition: A | Discharge status: Home / Self Care | Payer: Managed Care, Other (non HMO) | Source: Ambulatory Visit | Attending: Specialist | Admitting: Specialist

## 2014-06-15 DIAGNOSIS — M545 Low back pain, unspecified: Secondary | ICD-10-CM

## 2014-06-15 MED ORDER — METHYLPREDNISOLONE ACETATE 40 MG/ML INJ SUSP (RADIOLOG
120.0000 mg | Freq: Once | INTRAMUSCULAR | Status: AC
  Administered 2014-06-15: 120 mg via EPIDURAL

## 2014-06-15 MED ORDER — IOHEXOL 180 MG/ML  SOLN
1.0000 mL | Freq: Once | INTRAMUSCULAR | Status: AC | PRN
  Administered 2014-06-15: 1 mL via EPIDURAL

## 2014-06-15 NOTE — Discharge Instructions (Signed)

## 2014-12-05 ENCOUNTER — Encounter: Payer: Self-pay | Admitting: Internal Medicine

## 2015-02-06 ENCOUNTER — Ambulatory Visit: Payer: Managed Care, Other (non HMO) | Admitting: Internal Medicine

## 2016-06-17 DIAGNOSIS — K219 Gastro-esophageal reflux disease without esophagitis: Secondary | ICD-10-CM | POA: Diagnosis not present

## 2016-07-15 DIAGNOSIS — K219 Gastro-esophageal reflux disease without esophagitis: Secondary | ICD-10-CM | POA: Diagnosis not present

## 2016-07-15 DIAGNOSIS — Z79899 Other long term (current) drug therapy: Secondary | ICD-10-CM | POA: Diagnosis not present

## 2016-07-28 ENCOUNTER — Emergency Department (INDEPENDENT_AMBULATORY_CARE_PROVIDER_SITE_OTHER)
Admission: EM | Admit: 2016-07-28 | Discharge: 2016-07-28 | Disposition: A | Discharge status: Home / Self Care | Payer: BLUE CROSS/BLUE SHIELD | Source: Home / Self Care | Attending: Family Medicine | Admitting: Family Medicine

## 2016-07-28 ENCOUNTER — Encounter: Payer: Self-pay | Admitting: *Deleted

## 2016-07-28 DIAGNOSIS — M5441 Lumbago with sciatica, right side: Secondary | ICD-10-CM

## 2016-07-28 DIAGNOSIS — M5442 Lumbago with sciatica, left side: Secondary | ICD-10-CM

## 2016-07-28 MED ORDER — MELOXICAM 15 MG PO TABS: 15.0000 mg | ORAL_TABLET | Freq: Every day | ORAL | 0 refills | Status: DC

## 2016-07-28 MED ORDER — CYCLOBENZAPRINE HCL 10 MG PO TABS: 10.0000 mg | ORAL_TABLET | Freq: Two times a day (BID) | ORAL | 0 refills | Status: DC | PRN

## 2016-07-28 MED ORDER — HYDROCODONE-ACETAMINOPHEN 5-325 MG PO TABS: 1.0000 | ORAL_TABLET | Freq: Four times a day (QID) | ORAL | 0 refills | Status: DC | PRN

## 2016-07-28 NOTE — ED Triage Notes (Signed)
Pt c/o LBP x 2 wks. Hx of L5/L6 disc rupture.

## 2016-07-28 NOTE — ED Provider Notes (Signed)
CSN: [id number]     Arrival date & time 07/28/16  1802 History   First MD Initiated Contact with Patient 07/28/16 1822     Chief Complaint  Patient presents with  . Back Pain   (Consider location/radiation/quality/duration/timing/severity/associated sxs/prior Treatment) HPI  Michael Robinson is a 36 y.o. male presenting to UC with c/o exacerbation of lower back pain that started about 2 weeks ago, gradually worsening.  Pain is aching and sore, sharp and stiff at times.  Pain is 6/10, radiates down anterior thighs but no numbness. Hx of ruptured disc at L4/5 around 06/2014.  Symptoms gradually resolved but he believes due to recent travel on a plan, it has exacerbated his pain.  Denies change in bowel or bladder habits. He did see a spinal surgeon in 2015, who recommended spinal fusion but pt wants to hold off as he notes he is still young.  He has had some success with spinal injections in the past. He cannot tolerate oral prednisone.    Past Medical History:  Diagnosis Date  . GERD (gastroesophageal reflux disease)    Past Surgical History:  Procedure Laterality Date  . ANTERIOR CRUCIATE LIGAMENT REPAIR     History reviewed. No pertinent family history. Social History  Substance Use Topics  . Smoking status: Never Smoker  . Smokeless tobacco: Never Used  . Alcohol use Yes    Review of Systems  Constitutional: Negative for chills and fever.  Genitourinary: Negative for dysuria, flank pain, frequency and hematuria.  Musculoskeletal: Positive for back pain and myalgias. Negative for arthralgias.  Skin: Negative for color change, rash and wound.  Neurological: Negative for weakness and numbness.    Allergies  Patient has no known allergies.  Home Medications   Prior to Admission medications   Medication Sig Start Date End Date Taking? Authorizing Provider  Multiple Vitamin (ONE-A-DAY MENS PO) Take by mouth.   Yes Historical Provider, MD  omeprazole (PRILOSEC) 20 MG capsule  Take 20 mg by mouth daily.   Yes Historical Provider, MD  cyclobenzaprine (FLEXERIL) 10 MG tablet Take 1 tablet (10 mg total) by mouth 2 (two) times daily as needed. 07/28/16   Junius Finner, PA-C  HYDROcodone-acetaminophen (NORCO/VICODIN) 5-325 MG tablet Take 1-2 tablets by mouth every 6 (six) hours as needed for moderate pain or severe pain. 07/28/16   Junius Finner, PA-C  meloxicam (MOBIC) 15 MG tablet Take 1 tablet (15 mg total) by mouth daily. 07/28/16   Junius Finner, PA-C   Meds Ordered and Administered this Visit  Medications - No data to display  BP 117/74 (BP Location: Left Arm)   Pulse 62   Temp 98.2 F (36.8 C) (Oral)   Resp 16   Ht 6' (1.829 m)   Wt 220 lb (99.8 kg)   SpO2 99%   BMI 29.84 kg/m  No data found.   Physical Exam  Constitutional: He is oriented to person, place, and time. He appears well-developed and well-nourished.  HENT:  Head: Normocephalic and atraumatic.  Eyes: EOM are normal.  Neck: Normal range of motion.  Cardiovascular: Normal rate.   Pulmonary/Chest: Effort normal.  Musculoskeletal: Normal range of motion. He exhibits tenderness. He exhibits no edema.  No midline spinal tenderness. Tenderness to Left and Right lower lumbar muscles. Full ROM upper and lower extremities with 5/5 strength. Negative straight leg raise.   Neurological: He is alert and oriented to person, place, and time.  Normal gait.  Skin: Skin is warm and dry. No rash noted. No  erythema.  Psychiatric: He has a normal mood and affect. His behavior is normal.  Nursing note and vitals reviewed.   Urgent Care Course     Procedures (including critical care time)  Labs Review Labs Reviewed - No data to display  Imaging Review No results found.    MDM   1. Acute bilateral low back pain with bilateral sciatica    Pt c/o exacerbation of lower back pain. No red flag symptoms or bony tenderness. No indication for imaging at this time. Pt notes he has done well on muscle  relaxers and the meloxicam [facility]   Rx: Meloxicam, norco, and flexeril  Encouraged f/u with Sports Medicine in 1-2 weeks if not improving. Patient verbalized understanding and agreement with treatment plan.    Junius Finnerrin O'Malley, PA-C 07/28/16 1845

## 2016-07-28 NOTE — Discharge Instructions (Signed)
Norco/Vicodin (hydrocodone-acetaminophen) is a narcotic pain medication, do not combine these medications with others containing tylenol. While taking, do not drink alcohol, drive, or perform any other activities that requires focus while taking these medications.  ° °Flexeril is a muscle relaxer and may cause drowsiness. Do not drink alcohol, drive, or operate heavy machinery while taking. ° °Meloxicam (Mobic) is an antiinflammatory to help with pain and inflammation.  Do not take ibuprofen, Advil, Aleve, or any other medications that contain NSAIDs while taking meloxicam as this may cause stomach upset or even ulcers if taken in large amounts for an extended period of time.  ° ° °

## 2016-09-25 DIAGNOSIS — R6889 Other general symptoms and signs: Secondary | ICD-10-CM | POA: Diagnosis not present

## 2016-11-20 DIAGNOSIS — Z13810 Encounter for screening for upper gastrointestinal disorder: Secondary | ICD-10-CM | POA: Diagnosis not present

## 2016-11-20 DIAGNOSIS — K317 Polyp of stomach and duodenum: Secondary | ICD-10-CM | POA: Diagnosis not present

## 2016-11-20 DIAGNOSIS — K219 Gastro-esophageal reflux disease without esophagitis: Secondary | ICD-10-CM | POA: Diagnosis not present

## 2016-11-24 DIAGNOSIS — K317 Polyp of stomach and duodenum: Secondary | ICD-10-CM | POA: Diagnosis not present

## 2017-01-28 DIAGNOSIS — H6981 Other specified disorders of Eustachian tube, right ear: Secondary | ICD-10-CM | POA: Diagnosis not present

## 2017-01-28 DIAGNOSIS — R0982 Postnasal drip: Secondary | ICD-10-CM | POA: Diagnosis not present

## 2017-03-06 ENCOUNTER — Encounter: Payer: Self-pay | Admitting: Emergency Medicine

## 2017-03-06 ENCOUNTER — Emergency Department (INDEPENDENT_AMBULATORY_CARE_PROVIDER_SITE_OTHER)
Admission: EM | Admit: 2017-03-06 | Discharge: 2017-03-06 | Disposition: A | Discharge status: Home / Self Care | Payer: BLUE CROSS/BLUE SHIELD | Source: Home / Self Care | Attending: Family Medicine | Admitting: Family Medicine

## 2017-03-06 DIAGNOSIS — M5441 Lumbago with sciatica, right side: Secondary | ICD-10-CM | POA: Diagnosis not present

## 2017-03-06 MED ORDER — MELOXICAM 15 MG PO TABS: 15.0000 mg | ORAL_TABLET | Freq: Every day | ORAL | 0 refills | Status: DC

## 2017-03-06 MED ORDER — CYCLOBENZAPRINE HCL 10 MG PO TABS: 10.0000 mg | ORAL_TABLET | Freq: Every day | ORAL | 1 refills | Status: DC

## 2017-03-06 NOTE — Discharge Instructions (Signed)
Apply ice pack for 20 to 30 minutes, 3 to 4 times daily  Continue until pain and swelling decrease.  Begin back range of motion and stretching exercises as tolerated 

## 2017-03-06 NOTE — ED Provider Notes (Signed)
[facility]    CSN: [id number] Arrival date & time: 03/06/17  1714     History   Chief Complaint Chief Complaint  Patient presents with  . Back Pain    HPI Michael Robinson is a 36 y.o. male.   Patient has a past history of occasional low back pain.  1.5 weeks ago while lifting a 25 pound box, he twisted his back and felt sudden pain in his right lower back that radiates to his right knee.  His symptoms are similar to past episodes.   He denies bowel or bladder dysfunction, and no saddle numbness.  He notes that Mobic and Flexeril have been helpful in the past.   The history is provided by the patient.  Back Pain  Location:  Lumbar spine Quality:  Aching Radiates to:  R knee Pain severity:  Moderate Pain is:  Same all the time Onset quality:  Sudden Duration:  2 hours Timing:  Constant Progression:  Unchanged Chronicity:  Recurrent Context: lifting heavy objects   Relieved by:  Nothing Worsened by:  Bending, movement and ambulation Ineffective treatments:  Cold packs Associated symptoms: no abdominal pain, no abdominal swelling, no bladder incontinence, no bowel incontinence, no chest pain, no dysuria, no fever, no leg pain, no numbness, no paresthesias, no perianal numbness, no tingling, no weakness and no weight loss     Past Medical History:  Diagnosis Date  . GERD (gastroesophageal reflux disease)     Patient Active Problem List   Diagnosis Date Noted  . Chest pain 01/31/2014  . Family history of premature coronary artery disease 01/31/2014    Past Surgical History:  Procedure Laterality Date  . ANTERIOR CRUCIATE LIGAMENT REPAIR         Home Medications    Prior to Admission medications   Medication Sig Start Date End Date Taking? Authorizing Provider  cyclobenzaprine (FLEXERIL) 10 MG tablet Take 1 tablet (10 mg total) by mouth at bedtime. 03/06/17   Lattie HawBeese, Stephen A, MD  meloxicam (MOBIC) 15 MG tablet Take 1 tablet (15 mg total) by mouth  daily. Take with food each morning 03/06/17   Lattie HawBeese, Stephen A, MD    Family History History reviewed. No pertinent family history.  Social History Social History  Substance Use Topics  . Smoking status: Never Smoker  . Smokeless tobacco: Never Used  . Alcohol use Yes     Allergies   Patient has no known allergies.   Review of Systems Review of Systems  Constitutional: Negative for fever and weight loss.  Cardiovascular: Negative for chest pain.  Gastrointestinal: Negative for abdominal pain and bowel incontinence.  Genitourinary: Negative for bladder incontinence and dysuria.  Musculoskeletal: Positive for back pain.  Neurological: Negative for tingling, weakness, numbness and paresthesias.     Physical Exam Triage Vital Signs ED Triage Vitals [03/06/17 1726]  Enc Vitals Group     BP 114/78     Pulse Rate 71     Resp      Temp 98.4 F (36.9 C)     Temp Source Oral     SpO2 98 %     Weight 215 lb (97.5 kg)     Height 6' (1.829 m)     Head Circumference      Peak Flow      Pain Score 6     Pain Loc      Pain Edu?      Excl. in GC?    No data found.  Updated Vital Signs BP 114/78 (BP Location: Left Arm)   Pulse 71   Temp 98.4 F (36.9 C) (Oral)   Ht 6' (1.829 m)   Wt 215 lb (97.5 kg)   SpO2 98%   BMI 29.16 kg/m   Visual Acuity Right Eye Distance:   Left Eye Distance:   Bilateral Distance:    Right Eye Near:   Left Eye Near:    Bilateral Near:     Physical Exam  Constitutional: He appears well-developed and well-nourished. No distress.  HENT:  Head: Normocephalic.  Right Ear: External ear normal.  Left Ear: External ear normal.  Mouth/Throat: Oropharynx is clear and moist.  Eyes: Pupils are equal, round, and reactive to light. Conjunctivae are normal.  Neck: Normal range of motion.  Cardiovascular: Normal heart sounds.   Pulmonary/Chest: Breath sounds normal.  Abdominal: There is no tenderness.  Musculoskeletal: He exhibits no edema.        Lumbar back: He exhibits decreased range of motion and tenderness. He exhibits no bony tenderness, no swelling and no edema.       Back:  Patient has pain in his right lower back as noted on diagram, but no tenderness to palpation at that location.   Can heel/toe walk and squat without difficulty.  Decreased forward flexion.  Straight leg raising test is negative.  Sitting knee extension test is negative.  Strength and sensation in the lower extremities is normal.  Patellar and achilles reflexes are normal.   Neurological: He is alert.  Skin: Skin is warm and dry. No rash noted.  Nursing note and vitals reviewed.    UC Treatments / Results  Labs (all labs ordered are listed, but only abnormal results are displayed) Labs Reviewed - No data to display  EKG  EKG Interpretation None       Radiology No results found.  Procedures Procedures (including critical care time)  Medications Ordered in UC Medications - No data to display   Initial Impression / Assessment and Plan / UC Course  I have reviewed the triage vital signs and the nursing notes.  Pertinent labs & imaging results that were available during my care of the patient were reviewed by me and considered in my medical decision making (see chart for details).    Begin Mobic and Flexeril. Apply ice pack for 20 to 30 minutes, 3 to 4 times daily  Continue until pain and swelling decrease.  Begin back range of motion and stretching exercises as tolerated. Followup with Dr. Rodney Langton or Dr. Clementeen Graham ([facility]) if not improving about two weeks.     Final Clinical Impressions(s) / UC Diagnoses   Final diagnoses:  Acute right-sided low back pain with right-sided sciatica    New Prescriptions New Prescriptions   CYCLOBENZAPRINE (FLEXERIL) 10 MG TABLET    Take 1 tablet (10 mg total) by mouth at bedtime.   MELOXICAM (MOBIC) 15 MG TABLET    Take 1 tablet (15 mg total) by mouth daily. Take with  food each morning         Lattie Haw, MD 03/10/17 1345

## 2017-03-06 NOTE — ED Triage Notes (Signed)
Back Pain x1 week

## 2017-05-20 DIAGNOSIS — M5416 Radiculopathy, lumbar region: Secondary | ICD-10-CM | POA: Diagnosis not present

## 2017-05-21 ENCOUNTER — Other Ambulatory Visit: Payer: Self-pay | Admitting: Family Medicine

## 2017-05-21 DIAGNOSIS — M5416 Radiculopathy, lumbar region: Secondary | ICD-10-CM

## 2017-05-26 ENCOUNTER — Ambulatory Visit
Admission: RE | Admit: 2017-05-26 | Discharge: 2017-05-26 | Disposition: A | Discharge status: Home / Self Care | Payer: BLUE CROSS/BLUE SHIELD | Source: Ambulatory Visit | Attending: Family Medicine | Admitting: Family Medicine

## 2017-05-26 DIAGNOSIS — M5416 Radiculopathy, lumbar region: Secondary | ICD-10-CM

## 2017-05-26 DIAGNOSIS — M47817 Spondylosis without myelopathy or radiculopathy, lumbosacral region: Secondary | ICD-10-CM | POA: Diagnosis not present

## 2017-05-26 MED ORDER — IOPAMIDOL (ISOVUE-M 200) INJECTION 41%
1.0000 mL | Freq: Once | INTRAMUSCULAR | Status: AC
Start: 2017-05-26 — End: 2017-05-26
  Administered 2017-05-26: 1 mL via EPIDURAL

## 2017-05-26 MED ORDER — METHYLPREDNISOLONE ACETATE 40 MG/ML INJ SUSP (RADIOLOG
120.0000 mg | Freq: Once | INTRAMUSCULAR | Status: AC
  Administered 2017-05-26: 120 mg via EPIDURAL

## 2017-05-26 NOTE — Discharge Instructions (Signed)
Post Procedure Spinal Discharge Instruction Sheet  1. You may resume a regular diet and any medications that you routinely take (including pain medications).  2. No driving day of procedure.  3. Light activity throughout the rest of the day.  Do not do any strenuous work, exercise, bending or lifting.  The day following the procedure, you can resume normal physical activity but you should refrain from exercising or physical therapy for at least three days thereafter.   Common Side Effects:   Headaches- take your usual medications as directed by your physician.  Increase your fluid intake.  Caffeinated beverages may be helpful.  Lie flat in bed until your headache resolves.   Restlessness or inability to sleep- you may have trouble sleeping for the next few days.  Ask your referring physician if you need any medication for sleep.   Facial flushing or redness- should subside within a few days.   Increased pain- a temporary increase in pain a day or two following your procedure is not unusual.  Take your pain medication as prescribed by your referring physician.   Leg cramps  Please contact our office at [phone] for the following symptoms:  Fever greater than 100 degrees.  Headaches unresolved with medication after 2-3 days.  Increased swelling, pain, or redness at injection site.  Thank you for visiting our office.     May resume Goody's and BC's as needed.

## 2017-06-08 DIAGNOSIS — H35712 Central serous chorioretinopathy, left eye: Secondary | ICD-10-CM | POA: Diagnosis not present

## 2017-07-23 DIAGNOSIS — H2513 Age-related nuclear cataract, bilateral: Secondary | ICD-10-CM | POA: Diagnosis not present

## 2017-07-23 DIAGNOSIS — Q762 Congenital spondylolisthesis: Secondary | ICD-10-CM | POA: Diagnosis not present

## 2017-07-23 DIAGNOSIS — D3131 Benign neoplasm of right choroid: Secondary | ICD-10-CM | POA: Diagnosis not present

## 2017-07-23 DIAGNOSIS — H35713 Central serous chorioretinopathy, bilateral: Secondary | ICD-10-CM | POA: Diagnosis not present

## 2017-07-23 DIAGNOSIS — M5136 Other intervertebral disc degeneration, lumbar region: Secondary | ICD-10-CM | POA: Diagnosis not present

## 2017-07-23 DIAGNOSIS — M546 Pain in thoracic spine: Secondary | ICD-10-CM | POA: Diagnosis not present

## 2017-07-23 DIAGNOSIS — M549 Dorsalgia, unspecified: Secondary | ICD-10-CM | POA: Diagnosis not present

## 2017-07-23 DIAGNOSIS — M5126 Other intervertebral disc displacement, lumbar region: Secondary | ICD-10-CM | POA: Diagnosis not present

## 2017-07-23 DIAGNOSIS — M4306 Spondylolysis, lumbar region: Secondary | ICD-10-CM | POA: Diagnosis not present

## 2017-08-03 DIAGNOSIS — M48061 Spinal stenosis, lumbar region without neurogenic claudication: Secondary | ICD-10-CM | POA: Diagnosis not present

## 2017-08-03 DIAGNOSIS — M5416 Radiculopathy, lumbar region: Secondary | ICD-10-CM | POA: Diagnosis not present

## 2017-08-09 DIAGNOSIS — M4306 Spondylolysis, lumbar region: Secondary | ICD-10-CM | POA: Diagnosis not present

## 2017-08-09 DIAGNOSIS — M5126 Other intervertebral disc displacement, lumbar region: Secondary | ICD-10-CM | POA: Diagnosis not present

## 2017-08-09 DIAGNOSIS — Q762 Congenital spondylolisthesis: Secondary | ICD-10-CM | POA: Diagnosis not present

## 2017-08-09 DIAGNOSIS — M5136 Other intervertebral disc degeneration, lumbar region: Secondary | ICD-10-CM | POA: Diagnosis not present

## 2017-10-25 ENCOUNTER — Inpatient Hospital Stay: Admit: 2017-10-25 | Payer: BLUE CROSS/BLUE SHIELD | Admitting: Neurosurgery

## 2017-10-25 SURGERY — POSTERIOR LUMBAR FUSION 1 LEVEL
Anesthesia: General | Site: Back

## 2018-04-23 DIAGNOSIS — M545 Low back pain: Secondary | ICD-10-CM | POA: Diagnosis not present

## 2018-04-23 DIAGNOSIS — M5432 Sciatica, left side: Secondary | ICD-10-CM | POA: Diagnosis not present

## 2018-04-23 DIAGNOSIS — M62838 Other muscle spasm: Secondary | ICD-10-CM | POA: Diagnosis not present

## 2018-05-09 DIAGNOSIS — Z13220 Encounter for screening for lipoid disorders: Secondary | ICD-10-CM | POA: Diagnosis not present

## 2018-05-09 DIAGNOSIS — Z0000 Encounter for general adult medical examination without abnormal findings: Secondary | ICD-10-CM | POA: Diagnosis not present

## 2018-05-09 DIAGNOSIS — Z131 Encounter for screening for diabetes mellitus: Secondary | ICD-10-CM | POA: Diagnosis not present

## 2018-06-08 DIAGNOSIS — H35713 Central serous chorioretinopathy, bilateral: Secondary | ICD-10-CM | POA: Diagnosis not present

## 2018-06-08 DIAGNOSIS — D3131 Benign neoplasm of right choroid: Secondary | ICD-10-CM | POA: Diagnosis not present

## 2018-06-08 DIAGNOSIS — H2513 Age-related nuclear cataract, bilateral: Secondary | ICD-10-CM | POA: Diagnosis not present

## 2018-06-08 DIAGNOSIS — R51 Headache: Secondary | ICD-10-CM | POA: Diagnosis not present

## 2018-06-15 DIAGNOSIS — R51 Headache: Secondary | ICD-10-CM | POA: Diagnosis not present

## 2018-06-22 ENCOUNTER — Encounter: Payer: Self-pay | Admitting: Internal Medicine

## 2018-07-21 ENCOUNTER — Encounter: Payer: Self-pay | Admitting: Internal Medicine

## 2018-07-21 ENCOUNTER — Ambulatory Visit (INDEPENDENT_AMBULATORY_CARE_PROVIDER_SITE_OTHER): Payer: BLUE CROSS/BLUE SHIELD | Admitting: Internal Medicine

## 2018-07-21 VITALS — BP 120/80 | HR 78 | Ht 72.0 in | Wt 231.8 lb

## 2018-07-21 DIAGNOSIS — K219 Gastro-esophageal reflux disease without esophagitis: Secondary | ICD-10-CM | POA: Diagnosis not present

## 2018-07-21 MED ORDER — OMEPRAZOLE 20 MG PO CPDR: 20.0000 mg | DELAYED_RELEASE_CAPSULE | Freq: Every day | ORAL | 3 refills | Status: DC

## 2018-07-21 NOTE — Patient Instructions (Signed)
We have sent the following medications to your pharmacy for you to pick up at your convenience:  Omrprazole  Please follow up in one year

## 2018-07-21 NOTE — Progress Notes (Addendum)
HISTORY OF PRESENT ILLNESS:  Michael Robinson is a 38 y.o. 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  The patient tells me that he has had problems with classic reflux as manifested by pyrosis and regurgitation since college.  He requires at least once daily omeprazole 20 mg to control symptoms.  He will have breakthrough symptoms several times per month requiring twice daily therapy.  Off medication for more than 2 days, his symptoms are unbearable.  H2 receptor antagonist therapy does not help.  He denies dysphasia.  Patient tells me that he underwent upper endoscopy May 2019 with Dr. Willis Modena[facility] Outlaw [facility].  He reports no apparent abnormalities and that report has been requested for review.  The patient's concerns are the inconvenience of needing to take medication daily, the potential side effects of long-term PPI therapy, and cost.  He wonders about surgical options.  He denies a family history of Barrett's esophagus or esophageal cancer.  His GI review of systems is otherwise negative except for occasional bloating and some weight gain.  Current BMI is 31.4.  No relevant x-ray or laboratory data for review.  REVIEW OF SYSTEMS:  All non-GI ROS negative unless otherwise stated in the HPI except for back pain, left eye visual change  Past Medical History:  Diagnosis Date  . GERD (gastroesophageal reflux disease)     Past Surgical History:  Procedure Laterality Date  . ANTERIOR CRUCIATE LIGAMENT REPAIR      Social History Michael ReasonerBeau Garguilo  reports that he has never smoked. He has never used smokeless tobacco. He reports current alcohol use. He reports that he does not use drugs.  family history is not on file.  No Known Allergies     PHYSICAL EXAMINATION: Vital signs: BP 120/80   Pulse 78   Ht 6' (1.829 m)   Wt 231 lb 12.8 oz (105.1 kg)   SpO2 98%   BMI 31.44 kg/m   Constitutional: generally well-appearing,  no acute distress Psychiatric: alert and oriented x3, cooperative Eyes: extraocular movements intact, anicteric, conjunctiva pink Mouth: oral pharynx moist, no lesions Neck: supple no lymphadenopathy Cardiovascular: heart regular rate and rhythm, no murmur Lungs: clear to auscultation bilaterally Abdomen: soft, nontender, nondistended, no obvious ascites, no peritoneal signs, normal bowel sounds, no organomegaly Rectal: Omitted Extremities: no pain, cyanosis, or lower extremity edema bilaterally Skin: no lesions on visible extremities Neuro: No focal deficits.  Cranial nerves intact  ASSESSMENT:  1.  Chronic GERD.  No alarm features.  Requires PPI therapy for control of symptoms.  Apparently negative EGD May 2019   PLAN:  1.  I discussed with the patient in great detail treatment options for GERD.  We discussed reflux precautions in detail.  In his case weight loss would be most helpful.  We discussed medical therapy.  This included H2 receptor antagonist therapy which he reports is not helpful.  Next we discussed PPI therapy.  Most often the most cost affordable PPI at the lowest dose to control symptoms is recommended.  We also discussed the current knowledge base regarding the risks of long-term PPI therapy.  Next, we discussed classic surgical therapy to include Nissen fundoplication.  I discussed the general nature of the procedure, its general risks, and durability over time.  Finally, we discussed the current state of endoscopic therapies for reflux.  Limitations with regard to patient anatomy.  This included but was not limited to Stretta and TIF procedure.  As well we discussed the limited data with regards to trials and comparative studies.  What is known regarding side effects or complications as well as durability. 2. To this end we have decided to continue PPI therapy with annual follow-up to update the state of the art treatment of this condition.  He agrees.  I have prescribed  omeprazole 20 mg daily.  He knows to contact the office in the interim for questions or problems 3.  Follow-up in 1 year as stated  45 minutes spent face-to-face with the patient.  Greater than 50% of the time used for counseling regarding his chronic GERD and the above issues as outlined  ADDENDUM August 25, 2018: Outside records received and reviewed.  The patient underwent upper endoscopy with Dr. Willis Modena Nov 20, 2016.  The examination was normal except for benign fundic gland polyps which were biopsy-proven.

## 2018-09-12 DIAGNOSIS — H2513 Age-related nuclear cataract, bilateral: Secondary | ICD-10-CM | POA: Diagnosis not present

## 2018-09-12 DIAGNOSIS — D3131 Benign neoplasm of right choroid: Secondary | ICD-10-CM | POA: Diagnosis not present

## 2018-09-12 DIAGNOSIS — H35713 Central serous chorioretinopathy, bilateral: Secondary | ICD-10-CM | POA: Diagnosis not present

## 2018-09-12 DIAGNOSIS — R51 Headache: Secondary | ICD-10-CM | POA: Diagnosis not present

## 2018-10-31 DIAGNOSIS — Z9889 Other specified postprocedural states: Secondary | ICD-10-CM | POA: Diagnosis not present

## 2018-10-31 DIAGNOSIS — R51 Headache: Secondary | ICD-10-CM | POA: Diagnosis not present

## 2018-10-31 DIAGNOSIS — H2513 Age-related nuclear cataract, bilateral: Secondary | ICD-10-CM | POA: Diagnosis not present

## 2018-10-31 DIAGNOSIS — D3131 Benign neoplasm of right choroid: Secondary | ICD-10-CM | POA: Diagnosis not present

## 2018-10-31 DIAGNOSIS — H35713 Central serous chorioretinopathy, bilateral: Secondary | ICD-10-CM | POA: Diagnosis not present

## 2018-12-29 DIAGNOSIS — Z20828 Contact with and (suspected) exposure to other viral communicable diseases: Secondary | ICD-10-CM | POA: Diagnosis not present

## 2019-05-01 DIAGNOSIS — H35713 Central serous chorioretinopathy, bilateral: Secondary | ICD-10-CM | POA: Diagnosis not present

## 2019-05-01 DIAGNOSIS — D3131 Benign neoplasm of right choroid: Secondary | ICD-10-CM | POA: Diagnosis not present

## 2019-05-01 DIAGNOSIS — H2513 Age-related nuclear cataract, bilateral: Secondary | ICD-10-CM | POA: Diagnosis not present

## 2019-05-01 DIAGNOSIS — R519 Headache, unspecified: Secondary | ICD-10-CM | POA: Diagnosis not present

## 2019-06-12 DIAGNOSIS — D3131 Benign neoplasm of right choroid: Secondary | ICD-10-CM | POA: Diagnosis not present

## 2019-06-12 DIAGNOSIS — H2513 Age-related nuclear cataract, bilateral: Secondary | ICD-10-CM | POA: Diagnosis not present

## 2019-06-12 DIAGNOSIS — R519 Headache, unspecified: Secondary | ICD-10-CM | POA: Diagnosis not present

## 2019-06-12 DIAGNOSIS — H35713 Central serous chorioretinopathy, bilateral: Secondary | ICD-10-CM | POA: Diagnosis not present

## 2019-06-13 DIAGNOSIS — M109 Gout, unspecified: Secondary | ICD-10-CM | POA: Diagnosis not present

## 2019-07-12 DIAGNOSIS — Z0000 Encounter for general adult medical examination without abnormal findings: Secondary | ICD-10-CM | POA: Diagnosis not present

## 2019-07-26 DIAGNOSIS — Z131 Encounter for screening for diabetes mellitus: Secondary | ICD-10-CM | POA: Diagnosis not present

## 2019-07-26 DIAGNOSIS — Z13220 Encounter for screening for lipoid disorders: Secondary | ICD-10-CM | POA: Diagnosis not present

## 2019-08-23 DIAGNOSIS — R202 Paresthesia of skin: Secondary | ICD-10-CM | POA: Diagnosis not present

## 2019-08-23 DIAGNOSIS — M109 Gout, unspecified: Secondary | ICD-10-CM | POA: Diagnosis not present

## 2019-10-16 DIAGNOSIS — M5126 Other intervertebral disc displacement, lumbar region: Secondary | ICD-10-CM | POA: Diagnosis not present

## 2020-02-08 ENCOUNTER — Other Ambulatory Visit (HOSPITAL_COMMUNITY): Payer: Self-pay | Admitting: Family Medicine

## 2020-02-08 DIAGNOSIS — H5461 Unqualified visual loss, right eye, normal vision left eye: Secondary | ICD-10-CM

## 2020-02-13 ENCOUNTER — Other Ambulatory Visit: Payer: Self-pay

## 2020-02-13 ENCOUNTER — Ambulatory Visit (HOSPITAL_COMMUNITY)
Admission: RE | Admit: 2020-02-13 | Discharge: 2020-02-13 | Disposition: A | Discharge status: Home / Self Care | Payer: BC Managed Care – PPO | Source: Ambulatory Visit | Attending: Family Medicine | Admitting: Family Medicine

## 2020-02-13 DIAGNOSIS — H5461 Unqualified visual loss, right eye, normal vision left eye: Secondary | ICD-10-CM | POA: Diagnosis not present

## 2020-02-13 NOTE — Progress Notes (Signed)
Carotid duplex has been completed.   Preliminary results in CV Proc.   Blanch Media 02/13/2020 2:06 PM

## 2020-02-21 ENCOUNTER — Other Ambulatory Visit: Payer: Self-pay | Admitting: Family Medicine

## 2020-02-21 DIAGNOSIS — E236 Other disorders of pituitary gland: Secondary | ICD-10-CM

## 2020-02-26 ENCOUNTER — Other Ambulatory Visit: Payer: Self-pay

## 2020-02-26 ENCOUNTER — Ambulatory Visit (INDEPENDENT_AMBULATORY_CARE_PROVIDER_SITE_OTHER): Payer: BC Managed Care – PPO

## 2020-02-26 DIAGNOSIS — E236 Other disorders of pituitary gland: Secondary | ICD-10-CM

## 2020-02-26 MED ORDER — GADOBUTROL 1 MMOL/ML IV SOLN
10.0000 mL | Freq: Once | INTRAVENOUS | Status: AC | PRN
  Administered 2020-02-26: 10 mL via INTRAVENOUS

## 2020-07-04 ENCOUNTER — Emergency Department: Admit: 2020-07-04 | Discharge status: Absent without leave | Payer: Self-pay

## 2022-03-19 ENCOUNTER — Other Ambulatory Visit: Payer: Self-pay | Admitting: Neurosurgery

## 2022-04-01 ENCOUNTER — Other Ambulatory Visit: Payer: Self-pay | Admitting: Neurosurgery

## 2022-04-22 NOTE — Pre-Procedure Instructions (Signed)
Surgical Instructions    Your procedure is scheduled on Monday 05/04/22.   Report to [facility] Main Entrance "A" at 10:00 A.M., then check in with the Admitting office.  Call this number if you have problems the morning of surgery:  [phone]   If you have any questions prior to your surgery date call [phone]: Open Monday-Friday 8am-4pm If you experience any cold or flu symptoms such as cough, fever, chills, shortness of breath, etc. between now and your scheduled surgery, please notify us at the above number     Remember:  Do not eat or drink after midnight the night before your surgery    Take these medicines the morning of surgery with A SIP OF WATER:   omeprazole (PRILOSEC OTC)  traMADol (ULTRAM)- If needed   As of today, STOP taking any Aspirin (unless otherwise instructed by your surgeon) Aleve, Naproxen, Ibuprofen, Motrin, Advil, Goody's, BC's, all herbal medications, fish oil, meloxicam (MOBIC), and all vitamins.           Do not wear jewelry or makeup. Do not wear lotions, powders, perfumes/cologne or deodorant. Do not shave 48 hours prior to surgery.  Men may shave face and neck. Do not bring valuables to the hospital. Do not wear nail polish, gel polish, artificial nails, or any other type of covering on natural nails (fingers and toes) If you have artificial nails or gel coating that need to be removed by a nail salon, please have this removed prior to surgery. Artificial nails or gel coating may interfere with anesthesia's ability to adequately monitor your vital signs.  [facility] is not responsible for any belongings or valuables.    Do NOT Smoke (Tobacco/Vaping)  24 hours prior to your procedure  If you use a CPAP at night, you may bring your mask for your overnight stay.   Contacts, glasses, hearing aids, dentures or partials may not be worn into surgery, please bring cases for these belongings   For patients admitted to the hospital, discharge time  will be determined by your treatment team.   Patients discharged the day of surgery will not be allowed to drive home, and someone needs to stay with them for 24 hours.   SURGICAL WAITING ROOM VISITATION Patients having surgery or a procedure may have no more than 2 support people in the waiting area - these visitors may rotate.   Children under the age of 28 must have an adult with them who is not the patient. If the patient needs to stay at the hospital during part of their recovery, the visitor guidelines for inpatient rooms apply. Pre-op nurse will coordinate an appropriate time for 1 support person to accompany patient in pre-op.  This support person may not rotate.   Please refer to [email or web address] for the visitor guidelines for Inpatients (after your surgery is over and you are in a regular room).    Special instructions:    Oral Hygiene is also important to reduce your risk of infection.  Remember - BRUSH YOUR TEETH THE MORNING OF SURGERY WITH YOUR REGULAR TOOTHPASTE   [facility]- Preparing For Surgery  Before surgery, you can play an important role. Because skin is not sterile, your skin needs to be as free of germs as possible. You can reduce the number of germs on your skin by washing with CHG (chlorahexidine gluconate) Soap before surgery.  CHG is an antiseptic cleaner which kills germs and bonds with the skin to continue killing germs even after  washing.     Please do not use if you have an allergy to CHG or antibacterial soaps. If your skin becomes reddened/irritated stop using the CHG.  Do not shave (including legs and underarms) for at least 48 hours prior to first CHG shower. It is OK to shave your face.  Please follow these instructions carefully.     Shower the NIGHT BEFORE SURGERY and the MORNING OF SURGERY with CHG Soap.   If you chose to wash your hair, wash your hair first as usual with your normal shampoo.  After you shampoo, rinse your hair and body thoroughly to remove the shampoo.  Then [employer] and genitals (private parts) with your normal soap and rinse thoroughly to remove soap.  After that Use CHG Soap as you would any other liquid soap. You can apply CHG directly to the skin and wash gently with a scrungie or a clean washcloth.   Apply the CHG Soap to your body ONLY FROM THE NECK DOWN.  Do not use on open wounds or open sores. Avoid contact with your eyes, ears, mouth and genitals (private parts). Wash Face and genitals (private parts)  with your normal soap.   Wash thoroughly, paying special attention to the area where your surgery will be performed.  Thoroughly rinse your body with warm water from the neck down.  DO NOT shower/wash with your normal soap after using and rinsing off the CHG Soap.  Pat yourself dry with a CLEAN TOWEL.  Wear CLEAN PAJAMAS to bed the night before surgery  Place CLEAN SHEETS on your bed the night before your surgery  DO NOT SLEEP WITH PETS.   Day of Surgery:  Take a shower with CHG soap. Wear Clean/Comfortable clothing the morning of surgery Do not apply any deodorants/lotions.   Remember to brush your teeth WITH YOUR REGULAR TOOTHPASTE.    If you received a COVID test during your pre-op visit, it is requested that you wear a mask when out in public, stay away from anyone that may not be feeling well, and notify your surgeon if you develop symptoms. If you have been in contact with anyone that has tested positive in the last 10 days, please notify your surgeon.    Please read over the following fact sheets that you were given.

## 2022-04-23 ENCOUNTER — Other Ambulatory Visit: Payer: Self-pay

## 2022-04-23 ENCOUNTER — Encounter (HOSPITAL_COMMUNITY)
Admission: RE | Admit: 2022-04-23 | Discharge: 2022-04-23 | Disposition: A | Discharge status: Home / Self Care | Payer: BC Managed Care – PPO | Source: Ambulatory Visit | Attending: Neurosurgery | Admitting: Neurosurgery

## 2022-04-23 ENCOUNTER — Encounter (HOSPITAL_COMMUNITY): Payer: Self-pay

## 2022-04-23 VITALS — BP 112/85 | HR 62 | Temp 98.0°F | Resp 17 | Ht 72.0 in | Wt 228.9 lb

## 2022-04-23 DIAGNOSIS — Z01812 Encounter for preprocedural laboratory examination: Secondary | ICD-10-CM | POA: Insufficient documentation

## 2022-04-23 DIAGNOSIS — Z01818 Encounter for other preprocedural examination: Secondary | ICD-10-CM

## 2022-04-23 LAB — CBC
HCT: 43.6 % (ref 39.0–52.0)
Hemoglobin: 14.9 g/dL (ref 13.0–17.0)
MCH: 29.7 pg (ref 26.0–34.0)
MCHC: 34.2 g/dL (ref 30.0–36.0)
MCV: 87 fL (ref 80.0–100.0)
Platelets: 212 10*3/uL (ref 150–400)
RBC: 5.01 MIL/uL (ref 4.22–5.81)
RDW: 12.5 % (ref 11.5–15.5)
WBC: 5.1 10*3/uL (ref 4.0–10.5)
nRBC: 0 % (ref 0.0–0.2)

## 2022-04-23 LAB — TYPE AND SCREEN
ABO/RH(D): O POS
Antibody Screen: NEGATIVE

## 2022-04-23 LAB — SURGICAL PCR SCREEN
MRSA, PCR: NEGATIVE
Staphylococcus aureus: NEGATIVE

## 2022-04-23 NOTE — Progress Notes (Signed)
PCP - Dr. Cicero Duck Cardiologist - Denies  PPM/ICD - Denies  Chest x-ray - N/A EKG - N/A Stress Test - Denies ECHO - Denies Cardiac Cath - Denies  Sleep Study - Denies  Diabetes: Denies  Blood Thinner Instructions: N/A Aspirin Instructions: N/A  ERAS Protcol - No   COVID TEST- N/A   Anesthesia review: No  Patient denies shortness of breath, fever, cough and chest pain at PAT appointment   All instructions explained to the patient, with a verbal understanding of the material. Patient agrees to go over the instructions while at home for a better understanding. Patient also instructed to self quarantine after being tested for COVID-19. The opportunity to ask questions was provided.

## 2022-05-04 ENCOUNTER — Ambulatory Visit (HOSPITAL_COMMUNITY): Payer: BC Managed Care – PPO

## 2022-05-04 ENCOUNTER — Other Ambulatory Visit: Payer: Self-pay

## 2022-05-04 ENCOUNTER — Ambulatory Visit (HOSPITAL_COMMUNITY)
Admission: RE | Admit: 2022-05-04 | Discharge: 2022-05-05 | Disposition: A | Discharge status: Home / Self Care | Payer: BC Managed Care – PPO | Attending: Neurosurgery | Admitting: Neurosurgery

## 2022-05-04 ENCOUNTER — Ambulatory Visit (HOSPITAL_COMMUNITY): Payer: BC Managed Care – PPO | Admitting: Anesthesiology

## 2022-05-04 ENCOUNTER — Ambulatory Visit (HOSPITAL_COMMUNITY)
Admission: RE | Disposition: A | Discharge status: Home / Self Care | Payer: Self-pay | Source: Home / Self Care | Attending: Neurosurgery

## 2022-05-04 ENCOUNTER — Encounter (HOSPITAL_COMMUNITY): Payer: Self-pay | Admitting: Neurosurgery

## 2022-05-04 DIAGNOSIS — Z6830 Body mass index (BMI) 30.0-30.9, adult: Secondary | ICD-10-CM | POA: Insufficient documentation

## 2022-05-04 DIAGNOSIS — K219 Gastro-esophageal reflux disease without esophagitis: Secondary | ICD-10-CM | POA: Diagnosis not present

## 2022-05-04 DIAGNOSIS — M4316 Spondylolisthesis, lumbar region: Secondary | ICD-10-CM | POA: Diagnosis present

## 2022-05-04 DIAGNOSIS — E669 Obesity, unspecified: Secondary | ICD-10-CM | POA: Insufficient documentation

## 2022-05-04 LAB — ABO/RH: ABO/RH(D): O POS

## 2022-05-04 SURGERY — POSTERIOR LUMBAR FUSION 1 LEVEL
Anesthesia: General

## 2022-05-04 MED ORDER — ONDANSETRON HCL 4 MG/2ML IJ SOLN
INTRAMUSCULAR | Status: AC
  Filled 2022-05-04: qty 2

## 2022-05-04 MED ORDER — DOCUSATE SODIUM 100 MG PO CAPS
100.0000 mg | ORAL_CAPSULE | Freq: Two times a day (BID) | ORAL | Status: DC
  Administered 2022-05-04 – 2022-05-05 (×2): 100 mg via ORAL
  Filled 2022-05-04 (×2): qty 1

## 2022-05-04 MED ORDER — HYDROMORPHONE HCL 1 MG/ML IJ SOLN
INTRAMUSCULAR | Status: AC
  Filled 2022-05-04: qty 1

## 2022-05-04 MED ORDER — THROMBIN 5000 UNITS EX SOLR
CUTANEOUS | Status: AC
  Filled 2022-05-04: qty 5000

## 2022-05-04 MED ORDER — ONDANSETRON HCL 4 MG/2ML IJ SOLN
INTRAMUSCULAR | Status: DC | PRN
  Administered 2022-05-04: 4 mg via INTRAVENOUS

## 2022-05-04 MED ORDER — POTASSIUM CHLORIDE IN NACL 20-0.9 MEQ/L-% IV SOLN: INTRAVENOUS | Status: DC

## 2022-05-04 MED ORDER — ACETAMINOPHEN 500 MG PO TABS
1000.0000 mg | ORAL_TABLET | Freq: Four times a day (QID) | ORAL | Status: AC
  Administered 2022-05-04 – 2022-05-05 (×4): 1000 mg via ORAL
  Filled 2022-05-04 (×4): qty 2

## 2022-05-04 MED ORDER — ROCURONIUM BROMIDE 10 MG/ML (PF) SYRINGE
PREFILLED_SYRINGE | INTRAVENOUS | Status: AC
  Filled 2022-05-04: qty 10

## 2022-05-04 MED ORDER — PHENYLEPHRINE 80 MCG/ML (10ML) SYRINGE FOR IV PUSH (FOR BLOOD PRESSURE SUPPORT)
PREFILLED_SYRINGE | INTRAVENOUS | Status: DC | PRN
  Administered 2022-05-04: 160 ug via INTRAVENOUS
  Administered 2022-05-04 (×3): 80 ug via INTRAVENOUS

## 2022-05-04 MED ORDER — BACITRACIN ZINC 500 UNIT/GM EX OINT
TOPICAL_OINTMENT | CUTANEOUS | Status: DC | PRN
  Administered 2022-05-04: 1 via TOPICAL

## 2022-05-04 MED ORDER — ONDANSETRON HCL 4 MG/2ML IJ SOLN: 4.0000 mg | Freq: Four times a day (QID) | INTRAMUSCULAR | Status: DC | PRN

## 2022-05-04 MED ORDER — MORPHINE SULFATE (PF) 4 MG/ML IV SOLN
4.0000 mg | INTRAVENOUS | Status: DC | PRN
  Administered 2022-05-04 – 2022-05-05 (×4): 4 mg via INTRAVENOUS
  Filled 2022-05-04 (×4): qty 1

## 2022-05-04 MED ORDER — DIPHENHYDRAMINE HCL 50 MG/ML IJ SOLN
INTRAMUSCULAR | Status: DC | PRN
  Administered 2022-05-04: 12.5 mg via INTRAVENOUS

## 2022-05-04 MED ORDER — BISACODYL 10 MG RE SUPP: 10.0000 mg | Freq: Every day | RECTAL | Status: DC | PRN

## 2022-05-04 MED ORDER — BUPIVACAINE-EPINEPHRINE (PF) 0.5% -1:200000 IJ SOLN
INTRAMUSCULAR | Status: AC
  Filled 2022-05-04: qty 30

## 2022-05-04 MED ORDER — PANTOPRAZOLE SODIUM 20 MG PO TBEC
20.0000 mg | DELAYED_RELEASE_TABLET | Freq: Every day | ORAL | Status: DC
  Administered 2022-05-05: 20 mg via ORAL
  Filled 2022-05-04: qty 1

## 2022-05-04 MED ORDER — LIDOCAINE 2% (20 MG/ML) 5 ML SYRINGE
INTRAMUSCULAR | Status: AC
  Filled 2022-05-04: qty 5

## 2022-05-04 MED ORDER — PHENOL 1.4 % MT LIQD: 1.0000 | OROMUCOSAL | Status: DC | PRN

## 2022-05-04 MED ORDER — HYDROMORPHONE HCL 1 MG/ML IJ SOLN
0.2500 mg | INTRAMUSCULAR | Status: DC | PRN
  Administered 2022-05-04 (×2): 0.5 mg via INTRAVENOUS

## 2022-05-04 MED ORDER — SODIUM CHLORIDE 0.9% FLUSH: 3.0000 mL | INTRAVENOUS | Status: DC | PRN

## 2022-05-04 MED ORDER — BUPIVACAINE LIPOSOME 1.3 % IJ SUSP
INTRAMUSCULAR | Status: AC
  Filled 2022-05-04: qty 20

## 2022-05-04 MED ORDER — ACETAMINOPHEN 325 MG PO TABS: 650.0000 mg | ORAL_TABLET | ORAL | Status: DC | PRN

## 2022-05-04 MED ORDER — 0.9 % SODIUM CHLORIDE (POUR BTL) OPTIME
TOPICAL | Status: DC | PRN
  Administered 2022-05-04: 1000 mL

## 2022-05-04 MED ORDER — ONDANSETRON HCL 4 MG PO TABS: 4.0000 mg | ORAL_TABLET | Freq: Four times a day (QID) | ORAL | Status: DC | PRN

## 2022-05-04 MED ORDER — MENTHOL 3 MG MT LOZG: 1.0000 | LOZENGE | OROMUCOSAL | Status: DC | PRN

## 2022-05-04 MED ORDER — MIDAZOLAM HCL 2 MG/2ML IJ SOLN
INTRAMUSCULAR | Status: AC
  Filled 2022-05-04: qty 2

## 2022-05-04 MED ORDER — BACITRACIN ZINC 500 UNIT/GM EX OINT
TOPICAL_OINTMENT | CUTANEOUS | Status: AC
  Filled 2022-05-04: qty 28.35

## 2022-05-04 MED ORDER — OXYCODONE HCL 5 MG PO TABS: 5.0000 mg | ORAL_TABLET | ORAL | Status: DC | PRN

## 2022-05-04 MED ORDER — CYCLOBENZAPRINE HCL 10 MG PO TABS
10.0000 mg | ORAL_TABLET | Freq: Three times a day (TID) | ORAL | Status: DC | PRN
  Administered 2022-05-04 – 2022-05-05 (×2): 10 mg via ORAL
  Filled 2022-05-04 (×2): qty 1

## 2022-05-04 MED ORDER — BUPIVACAINE LIPOSOME 1.3 % IJ SUSP
INTRAMUSCULAR | Status: DC | PRN
  Administered 2022-05-04: 20 mL

## 2022-05-04 MED ORDER — FENTANYL CITRATE (PF) 250 MCG/5ML IJ SOLN
INTRAMUSCULAR | Status: DC | PRN
  Administered 2022-05-04 (×5): 50 ug via INTRAVENOUS

## 2022-05-04 MED ORDER — PROPOFOL 10 MG/ML IV BOLUS
INTRAVENOUS | Status: DC | PRN
  Administered 2022-05-04: 170 mg via INTRAVENOUS

## 2022-05-04 MED ORDER — PHENYLEPHRINE HCL-NACL 20-0.9 MG/250ML-% IV SOLN
INTRAVENOUS | Status: DC | PRN
  Administered 2022-05-04: 25 ug/min via INTRAVENOUS

## 2022-05-04 MED ORDER — SODIUM CHLORIDE 0.9% FLUSH
3.0000 mL | Freq: Two times a day (BID) | INTRAVENOUS | Status: DC
  Administered 2022-05-04: 3 mL via INTRAVENOUS

## 2022-05-04 MED ORDER — ACETAMINOPHEN 650 MG RE SUPP: 650.0000 mg | RECTAL | Status: DC | PRN

## 2022-05-04 MED ORDER — LIDOCAINE 2% (20 MG/ML) 5 ML SYRINGE
INTRAMUSCULAR | Status: DC | PRN
  Administered 2022-05-04: 80 mg via INTRAVENOUS

## 2022-05-04 MED ORDER — CEFAZOLIN SODIUM-DEXTROSE 2-4 GM/100ML-% IV SOLN
2.0000 g | INTRAVENOUS | Status: AC
  Administered 2022-05-04: 2 g via INTRAVENOUS
  Filled 2022-05-04: qty 100

## 2022-05-04 MED ORDER — OXYCODONE HCL 5 MG PO TABS
10.0000 mg | ORAL_TABLET | ORAL | Status: DC | PRN
  Administered 2022-05-04 – 2022-05-05 (×6): 10 mg via ORAL
  Filled 2022-05-04 (×6): qty 2

## 2022-05-04 MED ORDER — DEXAMETHASONE SODIUM PHOSPHATE 10 MG/ML IJ SOLN
INTRAMUSCULAR | Status: AC
  Filled 2022-05-04: qty 1

## 2022-05-04 MED ORDER — BUPIVACAINE-EPINEPHRINE (PF) 0.5% -1:200000 IJ SOLN
INTRAMUSCULAR | Status: DC | PRN
  Administered 2022-05-04: 10 mL

## 2022-05-04 MED ORDER — EPHEDRINE SULFATE-NACL 50-0.9 MG/10ML-% IV SOSY
PREFILLED_SYRINGE | INTRAVENOUS | Status: DC | PRN
  Administered 2022-05-04: 5 mg via INTRAVENOUS
  Administered 2022-05-04: 10 mg via INTRAVENOUS

## 2022-05-04 MED ORDER — FENTANYL CITRATE (PF) 100 MCG/2ML IJ SOLN
INTRAMUSCULAR | Status: AC
  Filled 2022-05-04: qty 2

## 2022-05-04 MED ORDER — LACTATED RINGERS IV SOLN: INTRAVENOUS | Status: DC

## 2022-05-04 MED ORDER — SODIUM CHLORIDE 0.9 % IV SOLN: 250.0000 mL | INTRAVENOUS | Status: DC

## 2022-05-04 MED ORDER — TRAMADOL HCL 50 MG PO TABS: 50.0000 mg | ORAL_TABLET | Freq: Four times a day (QID) | ORAL | Status: DC | PRN

## 2022-05-04 MED ORDER — PROMETHAZINE HCL 25 MG/ML IJ SOLN
6.2500 mg | INTRAMUSCULAR | Status: DC | PRN
  Administered 2022-05-04: 6.25 mg via INTRAVENOUS

## 2022-05-04 MED ORDER — KETAMINE HCL 50 MG/5ML IJ SOSY
PREFILLED_SYRINGE | INTRAMUSCULAR | Status: AC
  Filled 2022-05-04: qty 5

## 2022-05-04 MED ORDER — KETAMINE HCL 10 MG/ML IJ SOLN
INTRAMUSCULAR | Status: DC | PRN
  Administered 2022-05-04: 40 mg via INTRAVENOUS
  Administered 2022-05-04: 10 mg via INTRAVENOUS

## 2022-05-04 MED ORDER — SUGAMMADEX SODIUM 200 MG/2ML IV SOLN
INTRAVENOUS | Status: DC | PRN
  Administered 2022-05-04: 200 mg via INTRAVENOUS

## 2022-05-04 MED ORDER — PROMETHAZINE HCL 25 MG/ML IJ SOLN
INTRAMUSCULAR | Status: AC
  Filled 2022-05-04: qty 1

## 2022-05-04 MED ORDER — THROMBIN 5000 UNITS EX SOLR
OROMUCOSAL | Status: DC | PRN
  Administered 2022-05-04 (×2): 5 mL via TOPICAL

## 2022-05-04 MED ORDER — ORAL CARE MOUTH RINSE: 15.0000 mL | Freq: Once | OROMUCOSAL | Status: AC

## 2022-05-04 MED ORDER — EPHEDRINE 5 MG/ML INJ
INTRAVENOUS | Status: AC
  Filled 2022-05-04: qty 5

## 2022-05-04 MED ORDER — CHLORHEXIDINE GLUCONATE CLOTH 2 % EX PADS: 6.0000 | MEDICATED_PAD | Freq: Once | CUTANEOUS | Status: DC

## 2022-05-04 MED ORDER — MIDAZOLAM HCL 2 MG/2ML IJ SOLN
INTRAMUSCULAR | Status: DC | PRN
  Administered 2022-05-04: 2 mg via INTRAVENOUS

## 2022-05-04 MED ORDER — FENTANYL CITRATE (PF) 100 MCG/2ML IJ SOLN
25.0000 ug | INTRAMUSCULAR | Status: DC | PRN
  Administered 2022-05-04 (×3): 50 ug via INTRAVENOUS

## 2022-05-04 MED ORDER — CHLORHEXIDINE GLUCONATE 0.12 % MT SOLN
15.0000 mL | Freq: Once | OROMUCOSAL | Status: AC
  Administered 2022-05-04: 15 mL via OROMUCOSAL
  Filled 2022-05-04: qty 15

## 2022-05-04 MED ORDER — ROCURONIUM BROMIDE 10 MG/ML (PF) SYRINGE
PREFILLED_SYRINGE | INTRAVENOUS | Status: DC | PRN
  Administered 2022-05-04: 20 mg via INTRAVENOUS
  Administered 2022-05-04 (×2): 10 mg via INTRAVENOUS
  Administered 2022-05-04: 20 mg via INTRAVENOUS
  Administered 2022-05-04: 60 mg via INTRAVENOUS
  Administered 2022-05-04: 10 mg via INTRAVENOUS

## 2022-05-04 MED ORDER — FENTANYL CITRATE (PF) 250 MCG/5ML IJ SOLN
INTRAMUSCULAR | Status: AC
  Filled 2022-05-04: qty 5

## 2022-05-04 MED ORDER — ACETAMINOPHEN 500 MG PO TABS
1000.0000 mg | ORAL_TABLET | Freq: Once | ORAL | Status: AC
  Administered 2022-05-04: 1000 mg via ORAL
  Filled 2022-05-04: qty 2

## 2022-05-04 MED ORDER — ALBUTEROL SULFATE HFA 108 (90 BASE) MCG/ACT IN AERS
INHALATION_SPRAY | RESPIRATORY_TRACT | Status: AC
  Filled 2022-05-04: qty 6.7

## 2022-05-04 MED ORDER — CEFAZOLIN SODIUM-DEXTROSE 2-4 GM/100ML-% IV SOLN
2.0000 g | Freq: Three times a day (TID) | INTRAVENOUS | Status: AC
  Administered 2022-05-04 – 2022-05-05 (×2): 2 g via INTRAVENOUS
  Filled 2022-05-04 (×2): qty 100

## 2022-05-04 SURGICAL SUPPLY — 67 items
APL SKNCLS STERI-STRIP NONHPOA (GAUZE/BANDAGES/DRESSINGS) ×1
BAG COUNTER SPONGE SURGICOUNT (BAG) ×1 IMPLANT
BAG SPNG CNTER NS LX DISP (BAG) ×1
BASKET BONE COLLECTION (BASKET) ×1 IMPLANT
BENZOIN TINCTURE PRP APPL 2/3 (GAUZE/BANDAGES/DRESSINGS) ×1 IMPLANT
BLADE BONE MILL MEDIUM (MISCELLANEOUS) IMPLANT
BLADE CLIPPER SURG (BLADE) IMPLANT
BUR MATCHSTICK NEURO 3.0 LAGG (BURR) ×1 IMPLANT
BUR PRECISION FLUTE 6.0 (BURR) ×1 IMPLANT
CAGE ALTERA 10X31X9-13 15D (Cage) IMPLANT
CANISTER SUCT 3000ML PPV (MISCELLANEOUS) ×1 IMPLANT
CAP LOCK DLX THRD (Cap) IMPLANT
CNTNR URN SCR LID CUP LEK RST (MISCELLANEOUS) ×1 IMPLANT
CONT SPEC 4OZ STRL OR WHT (MISCELLANEOUS) ×1
COVER BACK TABLE 60X90IN (DRAPES) ×1 IMPLANT
DRAPE C-ARM 42X72 X-RAY (DRAPES) ×2 IMPLANT
DRAPE HALF SHEET 40X57 (DRAPES) ×1 IMPLANT
DRAPE LAPAROTOMY 100X72X124 (DRAPES) ×1 IMPLANT
DRAPE SURG 17X23 STRL (DRAPES) ×4 IMPLANT
DRSG OPSITE POSTOP 4X6 (GAUZE/BANDAGES/DRESSINGS) ×1 IMPLANT
ELECT BLADE 4.0 EZ CLEAN MEGAD (MISCELLANEOUS) ×1
ELECT REM PT RETURN 9FT ADLT (ELECTROSURGICAL) ×1
ELECTRODE BLDE 4.0 EZ CLN MEGD (MISCELLANEOUS) ×1 IMPLANT
ELECTRODE REM PT RTRN 9FT ADLT (ELECTROSURGICAL) ×1 IMPLANT
EVACUATOR 1/8 PVC DRAIN (DRAIN) IMPLANT
GAUZE 4X4 16PLY ~~LOC~~+RFID DBL (SPONGE) ×1 IMPLANT
GLOVE BIO SURGEON STRL SZ 6 (GLOVE) ×1 IMPLANT
GLOVE BIO SURGEON STRL SZ8 (GLOVE) ×2 IMPLANT
GLOVE BIO SURGEON STRL SZ8.5 (GLOVE) ×2 IMPLANT
GLOVE BIOGEL PI IND STRL 6.5 (GLOVE) ×1 IMPLANT
GLOVE ECLIPSE 7.0 STRL STRAW (GLOVE) IMPLANT
GLOVE EXAM NITRILE XL STR (GLOVE) IMPLANT
GLOVE INDICATOR 7.5 STRL GRN (GLOVE) IMPLANT
GOWN STRL REUS W/ TWL LRG LVL3 (GOWN DISPOSABLE) ×1 IMPLANT
GOWN STRL REUS W/ TWL XL LVL3 (GOWN DISPOSABLE) ×2 IMPLANT
GOWN STRL REUS W/TWL 2XL LVL3 (GOWN DISPOSABLE) IMPLANT
GOWN STRL REUS W/TWL LRG LVL3 (GOWN DISPOSABLE) ×4
GOWN STRL REUS W/TWL XL LVL3 (GOWN DISPOSABLE) ×3
HEMOSTAT POWDER KIT SURGIFOAM (HEMOSTASIS) ×1 IMPLANT
KIT BASIN OR (CUSTOM PROCEDURE TRAY) ×1 IMPLANT
KIT GRAFTMAG DEL NEURO DISP (NEUROSURGERY SUPPLIES) IMPLANT
KIT TURNOVER KIT B (KITS) ×1 IMPLANT
MILL BONE PREP (MISCELLANEOUS) IMPLANT
NDL HYPO 21X1.5 SAFETY (NEEDLE) IMPLANT
NEEDLE HYPO 21X1.5 SAFETY (NEEDLE) ×1 IMPLANT
NEEDLE HYPO 22GX1.5 SAFETY (NEEDLE) ×1 IMPLANT
NS IRRIG 1000ML POUR BTL (IV SOLUTION) ×1 IMPLANT
PACK LAMINECTOMY NEURO (CUSTOM PROCEDURE TRAY) ×1 IMPLANT
PAD ARMBOARD 7.5X6 YLW CONV (MISCELLANEOUS) ×3 IMPLANT
PATTIES SURGICAL .5 X1 (DISPOSABLE) IMPLANT
PATTIES SURGICAL 1X1 (DISPOSABLE) IMPLANT
PUTTY DBM 10CC CALC GRAN (Putty) IMPLANT
ROD CURVED TI 6.35X45 (Rod) IMPLANT
SCREW PA DLX CREO 7.5X55 (Screw) IMPLANT
SPIKE FLUID TRANSFER (MISCELLANEOUS) ×1 IMPLANT
SPONGE NEURO XRAY DETECT 1X3 (DISPOSABLE) IMPLANT
SPONGE SURGIFOAM ABS GEL 100 (HEMOSTASIS) IMPLANT
SPONGE T-LAP 4X18 ~~LOC~~+RFID (SPONGE) IMPLANT
STRIP CLOSURE SKIN 1/2X4 (GAUZE/BANDAGES/DRESSINGS) ×1 IMPLANT
SUT VIC AB 1 CT1 18XBRD ANBCTR (SUTURE) ×2 IMPLANT
SUT VIC AB 1 CT1 8-18 (SUTURE) ×2
SUT VIC AB 2-0 CP2 18 (SUTURE) ×2 IMPLANT
SYR 20ML LL LF (SYRINGE) IMPLANT
TOWEL GREEN STERILE (TOWEL DISPOSABLE) ×1 IMPLANT
TOWEL GREEN STERILE FF (TOWEL DISPOSABLE) ×1 IMPLANT
TRAY FOLEY MTR SLVR 16FR STAT (SET/KITS/TRAYS/PACK) ×1 IMPLANT
WATER STERILE IRR 1000ML POUR (IV SOLUTION) ×1 IMPLANT

## 2022-05-04 NOTE — Transfer of Care (Signed)
Immediate Anesthesia Transfer of Care Note  Patient: Brace Wohlfarth  Procedure(s) Performed: Posterior Lumbar Interbody Fusion,Interbody Prosthesis, POSTERIOR INSTRUMENTATION, Lumbar Four-Five  Patient Location: PACU  Anesthesia Type:General  Level of Consciousness: awake, alert  and oriented  Airway & Oxygen Therapy: Patient Spontanous Breathing and Patient connected to face mask oxygen  Post-op Assessment: Report given to RN and Post -op Vital signs reviewed and stable  Post vital signs: Reviewed and stable  Last Vitals:  Vitals Value Taken Time  BP 111/78 05/04/22 1535  Temp    Pulse 85 05/04/22 1540  Resp 21 05/04/22 1540  SpO2 96 % 05/04/22 1540  Vitals shown include unvalidated device data.  Last Pain:  Vitals:   05/04/22 1048  TempSrc:   PainSc: 4       Patients Stated Pain Goal: 0 (19/16/60 6004)  Complications: No notable events documented.

## 2022-05-04 NOTE — H&P (Signed)
Subjective: The patient is a 41 year old white male who has complained of back and leg pain.  He has failed medical management.  He was worked up with a lumbar MRI and lumbar x-rays which demonstrated an L4-5 spondylolysis and spondylolisthesis.  I discussed the various treatment options with him.  He has decided proceed with surgery.  Past Medical History:  Diagnosis Date   GERD (gastroesophageal reflux disease)     Past Surgical History:  Procedure Laterality Date   ANTERIOR CRUCIATE LIGAMENT REPAIR     ESOPHAGOGASTRODUODENOSCOPY     KNEE ARTHROSCOPY Left 2008   WISDOM TOOTH EXTRACTION      Allergies  Allergen Reactions   Prednisone     Causes fluid to leak into left eye (Pt has a condition that he is being treated for)    Social History   Tobacco Use   Smoking status: Never   Smokeless tobacco: Never  Substance Use Topics   Alcohol use: Yes    Comment: occasional    History reviewed. No pertinent family history. Prior to Admission medications   Medication Sig Start Date End Date Taking? Authorizing Provider  cyclobenzaprine (FLEXERIL) 10 MG tablet Take 1 tablet (10 mg total) by mouth at bedtime. Patient taking differently: Take 10 mg by mouth at bedtime as needed for muscle spasms. 03/06/17  Yes Kandra Nicolas, MD  meloxicam (MOBIC) 7.5 MG tablet Take 7.5 mg by mouth daily as needed for pain. 03/26/22  Yes [provider]  omeprazole (PRILOSEC OTC) 20 MG tablet Take 20 mg by mouth daily.   Yes [provider]  traMADol (ULTRAM) 50 MG tablet Take 50 mg by mouth every 6 (six) hours as needed for severe pain.   Yes [provider]     Review of Systems  Positive ROS: As above  All other systems have been reviewed and were otherwise negative with the exception of those mentioned in the HPI and as above.  Objective: Vital signs in last 24 hours: Temp:  [97.6 F (36.4 C)] 97.6 F (36.4 C) (10/30 1004) Pulse Rate:  [63] 63 (10/30 1004) Resp:   [17] 17 (10/30 1004) BP: (123)/(84) 123/84 (10/30 1004) SpO2:  [97 %] 97 % (10/30 1004) Weight:  [101.2 kg] 101.2 kg (10/30 1004) Estimated body mass index is 30.24 kg/m as calculated from the following:   Height as of this encounter: 6' (1.829 m).   Weight as of this encounter: 101.2 kg.   General Appearance: Alert Head: Normocephalic, without obvious abnormality, atraumatic Eyes: PERRL, conjunctiva/corneas clear, EOM's intact,    Ears: Normal  Throat: Normal  Neck: Supple, Back: unremarkable Lungs: Clear to auscultation bilaterally, respirations unlabored Heart: Regular rate and rhythm, no murmur, rub or gallop Abdomen: Soft, non-tender Extremities: Extremities normal, atraumatic, no cyanosis or edema Skin: unremarkable  NEUROLOGIC:   Mental status: alert and oriented,Motor Exam - grossly normal Sensory Exam - grossly normal Reflexes:  Coordination - grossly normal Gait - grossly normal Balance - grossly normal Cranial Nerves: I: smell Not tested  II: visual acuity  OS: Normal  OD: Normal   II: visual fields Full to confrontation  II: pupils Equal, round, reactive to light  III,VII: ptosis None  III,IV,VI: extraocular muscles  Full ROM  V: mastication Normal  V: facial light touch sensation  Normal  V,VII: corneal reflex  Present  VII: facial muscle function - upper  Normal  VII: facial muscle function - lower Normal  VIII: hearing Not tested  IX: soft palate  elevation  Normal  IX,X: gag reflex Present  XI: trapezius strength  5/5  XI: sternocleidomastoid strength 5/5  XI: neck flexion strength  5/5  XII: tongue strength  Normal    Data Review Lab Results  Component Value Date   WBC 5.1 04/23/2022   HGB 14.9 04/23/2022   HCT 43.6 04/23/2022   MCV 87.0 04/23/2022   PLT 212 04/23/2022   No results found for: "NA", "K", "CL", "CO2", "BUN", "CREATININE", "GLUCOSE" No results found for: "INR", "PROTIME"  Assessment/Plan: L4-5 spondylolysis,  spondylolisthesis, lumbago, lumbar radiculopathy: I have discussed the situation with the patient.  I reviewed his imaging studies with him and pointed out the abnormalities.  We have discussed the various treatment options including surgery.  I have described the surgical treatment option of an L4-5 decompression, instrumentation and fusion.  I have shown him surgical models.  I have given him a surgical pamphlet.  We have discussed the risk, benefits, alternatives, expected postoperative course, and likelihood of achieving our goals with surgery.  I have answered all his questions.  He has decided proceed with surgery.   Ophelia Charter 05/04/2022 10:39 AM

## 2022-05-04 NOTE — Anesthesia Preprocedure Evaluation (Signed)
Anesthesia Evaluation  Patient identified by MRN, date of birth, ID band Patient awake    Reviewed: Allergy & Precautions, NPO status , Patient's Chart, lab work & pertinent test results  Airway Mallampati: II  TM Distance: >3 FB Neck ROM: Full    Dental  (+) Teeth Intact, Dental Advisory Given   Pulmonary neg pulmonary ROS,    Pulmonary exam normal breath sounds clear to auscultation       Cardiovascular negative cardio ROS Normal cardiovascular exam Rhythm:Regular Rate:Normal     Neuro/Psych SPONDYLOLISTHESIS, LUMBAR REGION negative psych ROS   GI/Hepatic Neg liver ROS, GERD  Medicated,  Endo/Other  Obesity   Renal/GU negative Renal ROS     Musculoskeletal negative musculoskeletal ROS (+)   Abdominal   Peds  Hematology negative hematology ROS (+)   Anesthesia Other Findings Day of surgery medications reviewed with the patient.  Reproductive/Obstetrics                             Anesthesia Physical Anesthesia Plan  ASA: 2  Anesthesia Plan: General   Post-op Pain Management: Tylenol PO (pre-op)* and Ketamine IV*   Induction: Intravenous  PONV Risk Score and Plan: 3 and Midazolam, Dexamethasone and Ondansetron  Airway Management Planned: Oral ETT  Additional Equipment:   Intra-op Plan:   Post-operative Plan: Extubation in OR  Informed Consent: I have reviewed the patients History and Physical, chart, labs and discussed the procedure including the risks, benefits and alternatives for the proposed anesthesia with the patient or authorized representative who has indicated his/her understanding and acceptance.     Dental advisory given  Plan Discussed with: CRNA  Anesthesia Plan Comments:         Anesthesia Quick Evaluation

## 2022-05-04 NOTE — Progress Notes (Signed)
Orthopedic Tech Progress Note Patient Details:  [name] 1981-01-17 [id number]  PACU RN stated "he has brace"  Patient ID: Michael Robinson, male   DOB: 1980-12-16, 41 y.o.   MRN: [id number]  [name] 05/04/2022, 3:46 PM

## 2022-05-04 NOTE — Anesthesia Procedure Notes (Addendum)
Procedure Name: Intubation Date/Time: 05/04/2022 11:33 AM  Performed by: Harden Mo, CRNAPre-anesthesia Checklist: Patient identified, Emergency Drugs available, Suction available and Patient being monitored Patient Re-evaluated:Patient Re-evaluated prior to induction Oxygen Delivery Method: Circle System Utilized Preoxygenation: Pre-oxygenation with 100% oxygen Induction Type: IV induction Ventilation: Mask ventilation without difficulty Laryngoscope Size: Mac and 4 Grade View: Grade I Tube type: Oral Tube size: 7.5 mm Number of attempts: 1 Airway Equipment and Method: Stylet and Oral airway Placement Confirmation: ETT inserted through vocal cords under direct vision, positive ETCO2 and breath sounds checked- equal and bilateral Secured at: 23 cm Tube secured with: Tape Dental Injury: Teeth and Oropharynx as per pre-operative assessment  Comments: SRNA K Venrick

## 2022-05-04 NOTE — Op Note (Signed)
Brief history: The patient is a 41 year old white male who is complained of back and leg pain.  He failed medical management.  He was worked up with lumbar x-rays and lumbar MRI which demonstrated an L4-5 spondylolysis, spondylolisthesis, etc.  I discussed the various treatment options with him.  He has decided proceed with surgery.  Preoperative diagnosis: L4-5 spondylolisthesis, spondylolysis, degenerative disc disease, spinal stenosis compressing both the L4 and the L5 nerve roots; lumbago; lumbar radiculopathy; neurogenic claudication  Postoperative diagnosis: The same  Procedure: L4 Gill procedure/l laminectomy/foraminotomies/ facetectomy to decompress the bilateral L4 and L5 nerve roots(the work required to do this was in addition to the work required to do the posterior lumbar interbody fusion because of the patient's spinal stenosis, facet arthropathy. Etc. requiring a wide decompression of the nerve roots.);  L4-5 transforaminal lumbar interbody fusion with local morselized autograft bone and Zimmer DBM; insertion of interbody prosthesis at L4-5 (globus peek expandable interbody prosthesis); posterior nonsegmental instrumentation from L4 to L5 with globus titanium pedicle screws and rods; posterior lateral arthrodesis at L4-5 with local morselized autograft bone and Zimmer DBM.  Surgeon: Dr. Earle Gell  Asst.: Arnetha Massy, NP  Anesthesia: Gen. endotracheal  Estimated blood loss: 200 cc   Drains: None  Complications: None  Description of procedure: The patient was brought to the operating room by the anesthesia team. General endotracheal anesthesia was induced. The patient was turned to the prone position on the Wilson frame. The patient's lumbosacral region was then prepared with Betadine scrub and Betadine solution. Sterile drapes were applied.  I then injected the area to be incised with Marcaine with epinephrine solution. I then used the scalpel to make a linear midline incision  over the L4-5 interspace. I then used electrocautery to perform a bilateral subperiosteal dissection exposing the spinous process and lamina of L4 and L5. We then obtained intraoperative radiograph to confirm our location. We then inserted the Verstrac retractor to provide exposure.  I began the decompression by using the high speed drill to perform laminotomies at L4-5 bilaterally. We then used the Kerrison punches to widen the laminotomy and removed the ligamentum flavum at L4-5 bilaterally. We used the Kerrison punches to remove the L4 lamina and facets we performed wide foraminotomies about the bilateral L4 and L5 nerve roots completing the decompression.  We now turned our attention to the posterior lumbar interbody fusion. I used a scalpel to incise the intervertebral disc at L4-5 bilaterally.. I then performed a partial intervertebral discectomy at L4-5 bilaterally using the pituitary forceps. We prepared the vertebral endplates at D6-3 bilaterally for the fusion by removing the soft tissues with the curettes. We then used the trial spacers to pick the appropriate sized interbody prosthesis. We prefilled his prosthesis with a combination of local morselized autograft bone that we obtained during the decompression as well as Zimmer DBM. We inserted the prefilled prosthesis into the interspace at L4-5, we then turned and expanded the prosthesis. There was a good snug fit of the prosthesis in the interspace. We then filled and the remainder of the intervertebral disc space with local morselized autograft bone and Zimmer DBM. This completed the posterior lumbar interbody arthrodesis.  During the decompression and insertion of the prosthesis the assistant protected the thecal sac and nerve roots with the D'Errico retractor.  We now turned attention to the instrumentation. Under fluoroscopic guidance we cannulated the bilateral L4 and L5 pedicles with the bone probe. We then removed the bone probe. We then  tapped  the pedicle with a 6.5 millimeter tap. We then removed the tap. We probed inside the tapped pedicle with a ball probe to rule out cortical breaches. We then inserted a 7.5 x 55 millimeter pedicle screw into the L4 and L5 pedicles bilaterally under fluoroscopic guidance. We then palpated along the medial aspect of the pedicles to rule out cortical breaches. There were none. The nerve roots were not injured. We then connected the unilateral pedicle screws with a lordotic rod. We compressed the construct and secured the rod in place with the caps. We then tightened the caps appropriately. This completed the instrumentation from L4-5 bilaterally.  We now turned our attention to the posterior lateral arthrodesis at L4-5 bilaterally. We used the high-speed drill to decorticate transverse process at L4-5 bilaterally. We then applied a combination of local morselized autograft bone and Zimmer DBM over these decorticated posterior lateral structures. This completed the posterior lateral arthrodesis at L4-5.  We then obtained hemostasis using bipolar electrocautery. We irrigated the wound out with saline solution. We inspected the thecal sac and nerve roots and noted they were well decompressed. We then removed the retractor.  We injected Exparel . We reapproximated patient's thoracolumbar fascia with interrupted #1 Vicryl suture. We reapproximated patient's subcutaneous tissue with interrupted 2-0 Vicryl suture. The reapproximated patient's skin with Steri-Strips and benzoin. The wound was then coated with bacitracin ointment. A sterile dressing was applied. The drapes were removed. The patient was subsequently returned to the supine position where they were extubated by the anesthesia team. He was then transported to the post anesthesia care unit in stable condition. All sponge instrument and needle counts were reportedly correct at the end of this case.

## 2022-05-05 DIAGNOSIS — M4316 Spondylolisthesis, lumbar region: Secondary | ICD-10-CM | POA: Diagnosis not present

## 2022-05-05 MED ORDER — OXYCODONE-ACETAMINOPHEN 5-325 MG PO TABS: 1.0000 | ORAL_TABLET | ORAL | 0 refills | Status: AC | PRN

## 2022-05-05 MED ORDER — CYCLOBENZAPRINE HCL 10 MG PO TABS: 10.0000 mg | ORAL_TABLET | Freq: Three times a day (TID) | ORAL | 0 refills | Status: AC | PRN

## 2022-05-05 MED ORDER — DOCUSATE SODIUM 100 MG PO CAPS: 100.0000 mg | ORAL_CAPSULE | Freq: Two times a day (BID) | ORAL | 0 refills | Status: AC

## 2022-05-05 MED FILL — Thrombin For Soln 5000 Unit: CUTANEOUS | Qty: 5000 | Status: AC

## 2022-05-05 NOTE — Progress Notes (Signed)
Subjective: The patient is alert and pleasant.  He is in no apparent distress.  He complains of some nausea.  He had severe muscle spasms last night which has improved.  Objective: Vital signs in last 24 hours: Temp:  [97.6 F (36.4 C)-99.8 F (37.7 C)] 99.4 F (37.4 C) (10/31 0406) Pulse Rate:  [63-87] 87 (10/31 0406) Resp:  [11-20] 18 (10/31 0406) BP: (99-123)/(60-84) 105/60 (10/31 0406) SpO2:  [95 %-99 %] 96 % (10/31 0406) Weight:  [101.2 kg] 101.2 kg (10/30 1004) Estimated body mass index is 30.24 kg/m as calculated from the following:   Height as of this encounter: 6' (1.829 m).   Weight as of this encounter: 101.2 kg.   Intake/Output from previous day: 10/30 0701 - 10/31 0700 In: 2520 [P.O.:720; I.V.:1500; IV Piggyback:300] Out: 600 [Urine:350; Blood:250] Intake/Output this shift: No intake/output data recorded.  Physical exam the patient is alert and pleasant.  He is moving his lower extremities well.  Lab Results: No results for input(s): "WBC", "HGB", "HCT", "PLT" in the last 72 hours. BMET No results for input(s): "NA", "K", "CL", "CO2", "GLUCOSE", "BUN", "CREATININE", "CALCIUM" in the last 72 hours.  Studies/Results: DG Lumbar Spine 2-3 Views  Result Date: 05/04/2022 CLINICAL DATA:  Elective surgery. L4-L5 PLIF. EXAM: LUMBAR SPINE - 2-3 VIEW COMPARISON:  Localization radiograph earlier today. FINDINGS: Two fluoroscopic spot views of the lumbar spine obtained in the operating room in frontal and lateral projections. Pedicle screws at L4 and L5 with interbody spacer. Fluoroscopy time 16 seconds. Dose 12.69 mGy. IMPRESSION: Intraoperative fluoroscopy during L4-L5 PLIF. Electronically Signed   By: Keith Rake M.D.   On: 05/04/2022 15:21   DG Lumbar Spine 1 View  Result Date: 05/04/2022 CLINICAL DATA:  Elective surgery, intraop. Localization. EXAM: LUMBAR SPINE - 1 VIEW COMPARISON:  Lumbar radiograph 03/17/2022 demonstrating 5 non-rib-bearing lumbar vertebra  FINDINGS: Single cross-table lateral view of the lumbar spine obtained in the operating room. Surgical instrument projects over the L4-L5 disc space posteriorly just inferior to the L4 spinous process. IMPRESSION: Surgical instrument projects over the L4-L5 disc space posteriorly just inferior to the L4 spinous process. Electronically Signed   By: Keith Rake M.D.   On: 05/04/2022 15:19   DG C-Arm 1-60 Min-No Report  Result Date: 05/04/2022 Fluoroscopy was utilized by the requesting physician.  No radiographic interpretation.   DG C-Arm 1-60 Min-No Report  Result Date: 05/04/2022 Fluoroscopy was utilized by the requesting physician.  No radiographic interpretation.   DG C-Arm 1-60 Min-No Report  Result Date: 05/04/2022 Fluoroscopy was utilized by the requesting physician.  No radiographic interpretation.    Assessment/Plan: Postop day 1: We will mobilize him with PT and OT.  He may go home later on today.  I gave him his discharge instructions and answered all his questions.  LOS: 0 days     Ophelia Charter 05/05/2022, 7:47 AM

## 2022-05-05 NOTE — Discharge Summary (Signed)
Physician Discharge Summary     Providing Compassionate, Quality Care - Together   Patient ID: Michael Robinson MRN: [id number] DOB/AGE: May 13, 1981 41 y.o.  Admit date: 05/04/2022 Discharge date: 05/05/2022  Admission Diagnoses: Spondylolisthesis of lumbar region  Discharge Diagnoses:  Principal Problem:   Spondylolisthesis of lumbar region   Discharged Condition: good  Hospital Course: Patient underwent an L4-5 pLIF by Dr. Lovell Sheehan on 05/04/2022. He was admitted to 3C10  following recovery from anesthesia in the PACU. His postoperative course has been uncomplicated. He has worked with both physical and occupational therapies who feel the patient is ready for discharge home. He is ambulating independently and without difficulty. He is tolerating a normal diet. He is not having any bowel or bladder dysfunction. His pain is well-controlled with oral pain medication. He is ready for discharge home.   Consults: PT/OT  Significant Diagnostic Studies: radiology: DG Lumbar Spine 2-3 Views  Result Date: 05/04/2022 CLINICAL DATA:  Elective surgery. L4-L5 PLIF. EXAM: LUMBAR SPINE - 2-3 VIEW COMPARISON:  Localization radiograph earlier today. FINDINGS: Two fluoroscopic spot views of the lumbar spine obtained in the operating room in frontal and lateral projections. Pedicle screws at L4 and L5 with interbody spacer. Fluoroscopy time 16 seconds. Dose 12.69 mGy. IMPRESSION: Intraoperative fluoroscopy during L4-L5 PLIF. Electronically Signed   By: Narda Rutherford M.D.   On: 05/04/2022 15:21   DG Lumbar Spine 1 View  Result Date: 05/04/2022 CLINICAL DATA:  Elective surgery, intraop. Localization. EXAM: LUMBAR SPINE - 1 VIEW COMPARISON:  Lumbar radiograph 03/17/2022 demonstrating 5 non-rib-bearing lumbar vertebra FINDINGS: Single cross-table lateral view of the lumbar spine obtained in the operating room. Surgical instrument projects over the L4-L5 disc space posteriorly just inferior to the L4  spinous process. IMPRESSION: Surgical instrument projects over the L4-L5 disc space posteriorly just inferior to the L4 spinous process. Electronically Signed   By: Narda Rutherford M.D.   On: 05/04/2022 15:19   DG C-Arm 1-60 Min-No Report  Result Date: 05/04/2022 Fluoroscopy was utilized by the requesting physician.  No radiographic interpretation.   DG C-Arm 1-60 Min-No Report  Result Date: 05/04/2022 Fluoroscopy was utilized by the requesting physician.  No radiographic interpretation.   DG C-Arm 1-60 Min-No Report  Result Date: 05/04/2022 Fluoroscopy was utilized by the requesting physician.  No radiographic interpretation.   .res  Treatments: surgery: L4 Gill procedure/l laminectomy/foraminotomies/ facetectomy to decompress the bilateral L4 and L5 nerve roots(the work required to do this was in addition to the work required to do the posterior lumbar interbody fusion because of the patient's spinal stenosis, facet arthropathy. Etc. requiring a wide decompression of the nerve roots.);  L4-5 transforaminal lumbar interbody fusion with local morselized autograft bone and Zimmer DBM; insertion of interbody prosthesis at L4-5 (globus peek expandable interbody prosthesis); posterior nonsegmental instrumentation from L4 to L5 with globus titanium pedicle screws and rods; posterior lateral arthrodesis at L4-5 with local morselized autograft bone and Zimmer DBM.   Discharge Exam: Blood pressure 101/65, pulse 85, temperature 98.7 F (37.1 C), temperature source Oral, resp. rate 18, height 6' (1.829 m), weight 101.2 kg, SpO2 98 %.  Per report: Alert and oriented x 4 PERRLA CN II-XII grossly intact MAE, Strength and sensation intact Incision is covered with Honeycomb dressing and Steri Strips; Dressing is clean, dry, and intact   Disposition: Discharge disposition: 01-Home or Self Care       Discharge Instructions     Call MD for:  difficulty breathing, headache or visual  disturbances  Complete by: As directed    Call MD for:  persistant nausea and vomiting   Complete by: As directed    Call MD for:  redness, tenderness, or signs of infection (pain, swelling, redness, odor or green/yellow discharge around incision site)   Complete by: As directed    Call MD for:  severe uncontrolled pain   Complete by: As directed    Call MD for:  temperature >100.4   Complete by: As directed    Diet - low sodium heart healthy   Complete by: As directed    Increase activity slowly   Complete by: As directed    No wound care   Complete by: As directed    Remove dressing in 24 hours   Complete by: As directed       Allergies as of 05/05/2022       Reactions   Prednisone    Causes fluid to leak into left eye (Pt has a condition that he is being treated for)        Medication List     STOP taking these medications    meloxicam 7.5 MG tablet Commonly known as: MOBIC   traMADol 50 MG tablet Commonly known as: ULTRAM       TAKE these medications    cyclobenzaprine 10 MG tablet Commonly known as: FLEXERIL Take 1 tablet (10 mg total) by mouth 3 (three) times daily as needed for muscle spasms. What changed:  when to take this reasons to take this   docusate sodium 100 MG capsule Commonly known as: COLACE Take 1 capsule (100 mg total) by mouth 2 (two) times daily.   omeprazole 20 MG tablet Commonly known as: PRILOSEC OTC Take 20 mg by mouth daily.   oxyCODONE-acetaminophen 5-325 MG tablet Commonly known as: Percocet Take 1-2 tablets by mouth every 4 (four) hours as needed for severe pain.        Follow-up Information     Newman Pies, MD. Go on 05/26/2022.   Specialty: Neurosurgery Why: First post op appointment with x-rays is on 05/26/2022 at 8:15 AM. Contact information: [address] [place] [address] [phone]                 Signed: Viona Gilmore, DNP, AGNP-C Nurse  Practitioner  [facility] [place] [address], [place], [place] [address] P: [phone]    F: [phone]  05/05/2022, 10:22 AM

## 2022-05-05 NOTE — Anesthesia Postprocedure Evaluation (Signed)
Anesthesia Post Note  Patient: Market researcher  Procedure(s) Performed: Posterior Lumbar Interbody Fusion,Interbody Prosthesis, POSTERIOR INSTRUMENTATION, Lumbar Four-Five     Patient location during evaluation: PACU Anesthesia Type: General Level of consciousness: awake and alert Pain management: pain level controlled Vital Signs Assessment: post-procedure vital signs reviewed and stable Respiratory status: spontaneous breathing, nonlabored ventilation, respiratory function stable and patient connected to nasal cannula oxygen Cardiovascular status: blood pressure returned to baseline and stable Postop Assessment: no apparent nausea or vomiting Anesthetic complications: no   No notable events documented.  Last Vitals:  Vitals:   05/04/22 2312 05/05/22 0406  BP: 105/66 105/60  Pulse: 81 87  Resp: 18 18  Temp: 37.7 C 37.4 C  SpO2: 99% 96%    Last Pain:  Vitals:   05/05/22 0406  TempSrc: Oral  PainSc:                  Santa Lighter

## 2022-05-05 NOTE — Plan of Care (Signed)

## 2022-05-05 NOTE — Progress Notes (Signed)
Patient alert and oriented, voiding adequately, skin clean, dry and intact without evidence of skin break down, or symptoms of complications - no redness or edema noted, only slight tenderness at site.  Patient states pain is manageable at time of discharge. Patient has an appointment with MD in 3 weeks 

## 2022-05-05 NOTE — Evaluation (Signed)
Occupational Therapy Evaluation Patient Details Name: Michael Robinson MRN: [id number] DOB: [birth year] Today's Date: 05/05/2022   History of Present Illness Pt is a 41 y/o M s/p L 4-5 PLIF. PMH includes GERD   Clinical Impression   Pt independent at baseline with ADLs and functional mobility, pt needing supervision - mod A for ADLs, mod I for bed mobility, and supervision for transfers. Pt educated on log roll technique, brace wear, and compensatory strategies for ADLs, pt defers getting dressed due to possibility of d/cing tomorrow, however verbalizes understanding of compensatory strategies and plans to have wife assist vs use of AE. Pt presenting with impairments listed below, will follow acutely. Recommend d/c home with family assistance.     Recommendations for follow up therapy are one component of a multi-disciplinary discharge planning process, led by the attending physician.  Recommendations may be updated based on patient status, additional functional criteria and insurance authorization.   Follow Up Recommendations  No OT follow up    Assistance Recommended at Discharge Frequent or constant Supervision/Assistance  Patient can return home with the following A little help with bathing/dressing/bathroom;Assist for transportation;Help with stairs or ramp for entrance;Assistance with cooking/housework    Functional Status Assessment  Patient has had a recent decline in their functional status and demonstrates the ability to make significant improvements in function in a reasonable and predictable amount of time.  Equipment Recommendations  BSC/3in1    Recommendations for Other Services PT consult     Precautions / Restrictions Precautions Precautions: Back Precaution Booklet Issued: Yes (comment) Precaution Comments: educated on 3/3 back precautions. Handout provided Required Braces or Orthoses: Spinal Brace Spinal Brace: Lumbar corset;Applied in sitting  position Restrictions Weight Bearing Restrictions: No Other Position/Activity Restrictions: OK to ambulate to bathroom without brace      Mobility Bed Mobility Overal bed mobility: Modified Independent             General bed mobility comments: log roll technique    Transfers Overall transfer level: Needs assistance Equipment used: None Transfers: Sit to/from Stand Sit to Stand: Supervision                  Balance Overall balance assessment: No apparent balance deficits (not formally assessed)                                         ADL either performed or assessed with clinical judgement   ADL Overall ADL's : Needs assistance/impaired Eating/Feeding: Modified independent   Grooming: Modified independent Grooming Details (indicate cue type and reason): brushing teeth at sink Upper Body Bathing: Minimal assistance   Lower Body Bathing: Moderate assistance   Upper Body Dressing : Minimal assistance   Lower Body Dressing: Moderate assistance   Toilet Transfer: Supervision/safety;Ambulation;Regular Museum/gallery exhibitions officer and Hygiene: Supervision/safety       Functional mobility during ADLs: Supervision/safety       Vision   Vision Assessment?: No apparent visual deficits     Perception     Praxis      Pertinent Vitals/Pain Pain Assessment Pain Assessment: Faces Pain Score: 5  Faces Pain Scale: Hurts even more Pain Location: back Pain Descriptors / Indicators: Spasm, Tightness, Grimacing, Guarding Pain Intervention(s): Limited activity within patient's tolerance, Monitored during session, Repositioned     Hand Dominance     Extremity/Trunk Assessment Upper Extremity Assessment Upper Extremity Assessment: Overall [facility]  for tasks assessed   Lower Extremity Assessment Lower Extremity Assessment: Defer to PT evaluation   Cervical / Trunk Assessment Cervical / Trunk Assessment: Back Surgery    Communication Communication Communication: No difficulties   Cognition Arousal/Alertness: Awake/alert Behavior During Therapy: WFL for tasks assessed/performed Overall Cognitive Status: Within Functional Limits for tasks assessed                                       General Comments  VSS on RA    Exercises     Shoulder Instructions      Home Living Family/patient expects to be discharged to:: Private residence Living Arrangements: Spouse/significant other;Children Available Help at Discharge: Family;Available 24 hours/day Type of Home: House Home Access: Stairs to enter [employer] of Steps: 3 Entrance Stairs-Rails: None Home Layout: Two level;Able to live on main level with bedroom/bathroom     Bathroom Shower/Tub: Producer, television/film/video: Handicapped height Bathroom Accessibility: Yes   Home Equipment: None          Prior Functioning/Environment Prior Level of Function : Independent/Modified Independent;Driving                        OT Problem List: Decreased strength;Decreased range of motion;Decreased activity tolerance;Impaired balance (sitting and/or standing)      OT Treatment/Interventions: Self-care/ADL training;Therapeutic exercise;Energy conservation;DME and/or AE instruction;Therapeutic activities;Patient/family education;Balance training    OT Goals(Current goals can be found in the care plan section) Acute Rehab OT Goals Patient Stated Goal: none stated OT Goal Formulation: With patient Time For Goal Achievement: 05/19/22 Potential to Achieve Goals: Good ADL Goals Pt Will Perform Upper Body Dressing: with modified independence;sitting Pt Will Perform Lower Body Dressing: with modified independence;sitting/lateral leans;sit to/from stand;bed level Pt Will Transfer to Toilet: with modified independence;ambulating;regular height toilet Pt Will Perform Tub/Shower Transfer: Shower  transfer;ambulating;with modified independence  OT Frequency: Min 2X/week    Co-evaluation              AM-PAC OT "6 Clicks" Daily Activity     Outcome Measure Help from another person eating meals?: None Help from another person taking care of personal grooming?: None Help from another person toileting, which includes using toliet, bedpan, or urinal?: A Little Help from another person bathing (including washing, rinsing, drying)?: A Lot Help from another person to put on and taking off regular upper body clothing?: A Little Help from another person to put on and taking off regular lower body clothing?: A Lot 6 Click Score: 18   End of Session Equipment Utilized During Treatment: Back brace Nurse Communication: Mobility status  Activity Tolerance: Patient tolerated treatment well Patient left: in bed;with call bell/phone within reach  OT Visit Diagnosis: Unsteadiness on feet (R26.81);Other abnormalities of gait and mobility (R26.89);Muscle weakness (generalized) (M62.81)                Time: 5631-4970 OT Time Calculation (min): 17 min Charges:  OT General Charges $OT Visit: 1 Visit OT Evaluation $OT Eval Low Complexity: 1 Low  Alfonzo Beers, OTD, OTR/L Acute Rehab [phone]  Rolm Gala Aren Pryde 05/05/2022, 10:14 AM

## 2022-05-05 NOTE — Evaluation (Signed)
Physical Therapy Evaluation Patient Details Name: Michael Robinson MRN: [id number] DOB: 1981-03-09 Today's Date: 05/05/2022  History of Present Illness  Pt is a 41 y/o M s/p L 4-5 PLIF. PMH includes GERD  Clinical Impression  Patient is s/p above surgery resulting in the deficits listed below (see PT Problem List). PTA pt lived at home with his wife, independent. On eval, he required supervision transfers, and min guard progressing to supervision ambulation 400' without AD. Stairs deferred due to pt with reports of feeling lightheaded after taking muscle relaxer. Patient will benefit from skilled PT to increase their independence and safety with mobility (while adhering to their precautions) to allow discharge to the venue listed below. Recommending cane due to no rails on home entry steps. PT to follow acutely. No follow up services indicated.        Recommendations for follow up therapy are one component of a multi-disciplinary discharge planning process, led by the attending physician.  Recommendations may be updated based on patient status, additional functional criteria and insurance authorization.  Follow Up Recommendations No PT follow up      Assistance Recommended at Discharge PRN  Patient can return home with the following  Help with stairs or ramp for entrance;Assist for transportation    Equipment Recommendations Cane  Recommendations for Other Services       Functional Status Assessment Patient has had a recent decline in their functional status and demonstrates the ability to make significant improvements in function in a reasonable and predictable amount of time.     Precautions / Restrictions Precautions Precautions: Back Precaution Booklet Issued: Yes (comment) Precaution Comments: educated on 3/3 back precautions. Handout provided Required Braces or Orthoses: Spinal Brace Spinal Brace: Lumbar corset;Applied in sitting position Restrictions Weight Bearing  Restrictions: No Other Position/Activity Restrictions: OK to ambulate to bathroom without brace      Mobility  Bed Mobility Overal bed mobility: Modified Independent             General bed mobility comments: +rail, HOB at 20 degrees, increased time    Transfers Overall transfer level: Needs assistance Equipment used: None Transfers: Sit to/from Stand Sit to Stand: Supervision                Ambulation/Gait Ambulation/Gait assistance: Min guard, Supervision Gait Distance (Feet): 400 Feet Assistive device: None Gait Pattern/deviations: Step-through pattern, Decreased stride length Gait velocity: decreased Gait velocity interpretation: 1.31 - 2.62 ft/sec, indicative of limited community ambulator   General Gait Details: guarded gait. Pt with c/o feeling lightheaded from muscle relaxer. No LOB  Stairs Stairs:  (deferred due to pt lightheaded)          Wheelchair Mobility    Modified Rankin (Stroke Patients Only)       Balance Overall balance assessment: No apparent balance deficits (not formally assessed)                                           Pertinent Vitals/Pain Pain Assessment Pain Assessment: 0-10 Pain Score: 6  Pain Location: back Pain Descriptors / Indicators: Spasm, Tightness, Grimacing, Guarding Pain Intervention(s): Monitored during session, Premedicated before session, Limited activity within patient's tolerance    Home Living Family/patient expects to be discharged to:: Private residence Living Arrangements: Spouse/significant other;Children Available Help at Discharge: Family;Available 24 hours/day Type of Home: House Home Access: Stairs to enter Entrance Stairs-Rails: None Entrance  Stairs-Number of Steps: 3   Home Layout: Two level;Able to live on main level with bedroom/bathroom Home Equipment: None      Prior Function Prior Level of Function : Independent/Modified Independent;Driving                      Hand Dominance        Extremity/Trunk Assessment   Upper Extremity Assessment Upper Extremity Assessment: Overall WFL for tasks assessed    Lower Extremity Assessment Lower Extremity Assessment: Overall WFL for tasks assessed    Cervical / Trunk Assessment Cervical / Trunk Assessment: Back Surgery  Communication   Communication: No difficulties  Cognition Arousal/Alertness: Awake/alert Behavior During Therapy: WFL for tasks assessed/performed Overall Cognitive Status: Within Functional Limits for tasks assessed                                          General Comments      Exercises     Assessment/Plan    PT Assessment Patient needs continued PT services  PT Problem List Decreased mobility;Decreased activity tolerance;Pain;Decreased knowledge of precautions       PT Treatment Interventions DME instruction;Therapeutic activities;Gait training;Stair training;Functional mobility training;Patient/family education    PT Goals (Current goals can be found in the Care Plan section)  Acute Rehab PT Goals Patient Stated Goal: decrease pain PT Goal Formulation: With patient Time For Goal Achievement: 05/12/22 Potential to Achieve Goals: Good    Frequency Min 5X/week     Co-evaluation               AM-PAC PT "6 Clicks" Mobility  Outcome Measure Help needed turning from your back to your side while in a flat bed without using bedrails?: None Help needed moving from lying on your back to sitting on the side of a flat bed without using bedrails?: None Help needed moving to and from a bed to a chair (including a wheelchair)?: A Little Help needed standing up from a chair using your arms (e.g., wheelchair or bedside chair)?: A Little Help needed to walk in hospital room?: A Little Help needed climbing 3-5 steps with a railing? : A Little 6 Click Score: 20    End of Session Equipment Utilized During Treatment: Gait belt;Back  brace Activity Tolerance: Patient tolerated treatment well Patient left: in bed (EOB with OT) Nurse Communication: Mobility status PT Visit Diagnosis: Difficulty in walking, not elsewhere classified (R26.2);Pain    Time: 8676-7209 PT Time Calculation (min) (ACUTE ONLY): 20 min   Charges:   PT Evaluation $PT Eval Moderate Complexity: 1 Mod          Lorrin Goodell, PT  Office # [phone] Pager [phone]   Lorriane Shire 05/05/2022, 8:32 AM

## 2022-05-05 NOTE — Discharge Instructions (Signed)
Wound Care Keep incision covered and dry for two days.    Do not put any creams, lotions, or ointments on incision. Leave steri-strips on back.  They will fall off by themselves. You are fine to shower. Let water run over incision and pat dry.  Activity Walk each and every day, increasing distance each day. No lifting greater than 5 lbs.  Avoid excessive back motion. No driving for 2 weeks; may ride as a passenger locally.  Diet Resume your normal diet.   Return to Work Will be discussed at your follow up appointment.  Call Your Doctor If Any of These Occur Redness, drainage, or swelling at the wound.  Temperature greater than 101 degrees. Severe pain not relieved by pain medication. Incision starts to come apart.  Follow Up Appt Call [phone] today for appointment in 2-3 weeks if you don't already have one or for any problems.  If you have any hardware placed in your spine, you will need an x-ray before your appointment.  

## 2022-05-14 MED FILL — Heparin Sodium (Porcine) Inj 1000 Unit/ML: INTRAMUSCULAR | Qty: 30 | Status: AC

## 2022-05-14 MED FILL — Sodium Chloride IV Soln 0.9%: INTRAVENOUS | Qty: 2000 | Status: AC
